# Patient Record
Sex: Female | Born: 1950 | Race: White | Hispanic: No | Marital: Single | Smoking: Current every day smoker
Health system: Southern US, Community
[De-identification: ages and names within clinical notes are randomized; demographics above are authoritative.]

## PROBLEM LIST (undated history)

## (undated) DIAGNOSIS — R0989 Other specified symptoms and signs involving the circulatory and respiratory systems: Secondary | ICD-10-CM

## (undated) DIAGNOSIS — I219 Acute myocardial infarction, unspecified: Secondary | ICD-10-CM

## (undated) DIAGNOSIS — K219 Gastro-esophageal reflux disease without esophagitis: Secondary | ICD-10-CM

## (undated) DIAGNOSIS — F4310 Post-traumatic stress disorder, unspecified: Secondary | ICD-10-CM

## (undated) DIAGNOSIS — C801 Malignant (primary) neoplasm, unspecified: Secondary | ICD-10-CM

## (undated) DIAGNOSIS — R011 Cardiac murmur, unspecified: Secondary | ICD-10-CM

## (undated) DIAGNOSIS — J449 Chronic obstructive pulmonary disease, unspecified: Secondary | ICD-10-CM

## (undated) DIAGNOSIS — F32A Depression, unspecified: Secondary | ICD-10-CM

## (undated) DIAGNOSIS — O039 Complete or unspecified spontaneous abortion without complication: Secondary | ICD-10-CM

## (undated) DIAGNOSIS — J189 Pneumonia, unspecified organism: Secondary | ICD-10-CM

## (undated) DIAGNOSIS — I10 Essential (primary) hypertension: Secondary | ICD-10-CM

## (undated) DIAGNOSIS — I519 Heart disease, unspecified: Secondary | ICD-10-CM

## (undated) DIAGNOSIS — I509 Heart failure, unspecified: Secondary | ICD-10-CM

## (undated) DIAGNOSIS — R0602 Shortness of breath: Secondary | ICD-10-CM

## (undated) DIAGNOSIS — M1990 Unspecified osteoarthritis, unspecified site: Secondary | ICD-10-CM

## (undated) DIAGNOSIS — F429 Obsessive-compulsive disorder, unspecified: Secondary | ICD-10-CM

## (undated) DIAGNOSIS — J40 Bronchitis, not specified as acute or chronic: Secondary | ICD-10-CM

## (undated) DIAGNOSIS — Z95810 Presence of automatic (implantable) cardiac defibrillator: Secondary | ICD-10-CM

## (undated) DIAGNOSIS — E7800 Pure hypercholesterolemia, unspecified: Secondary | ICD-10-CM

## (undated) DIAGNOSIS — I4891 Unspecified atrial fibrillation: Secondary | ICD-10-CM

## (undated) DIAGNOSIS — F419 Anxiety disorder, unspecified: Secondary | ICD-10-CM

## (undated) DIAGNOSIS — F329 Major depressive disorder, single episode, unspecified: Secondary | ICD-10-CM

## (undated) HISTORY — DX: Heart disease, unspecified: I51.9

## (undated) HISTORY — DX: Other specified symptoms and signs involving the circulatory and respiratory systems: R09.89

## (undated) HISTORY — DX: Obsessive-compulsive disorder, unspecified: F42.9

## (undated) HISTORY — DX: Anxiety disorder, unspecified: F41.9

## (undated) HISTORY — DX: Essential (primary) hypertension: I10

## (undated) HISTORY — DX: Malignant (primary) neoplasm, unspecified: C80.1

## (undated) HISTORY — DX: Bronchitis, not specified as acute or chronic: J40

## (undated) HISTORY — DX: Pure hypercholesterolemia, unspecified: E78.00

## (undated) HISTORY — DX: Pneumonia, unspecified organism: J18.9

## (undated) HISTORY — DX: Unspecified osteoarthritis, unspecified site: M19.90

## (undated) HISTORY — DX: Chronic obstructive pulmonary disease, unspecified: J44.9

## (undated) HISTORY — DX: Post-traumatic stress disorder, unspecified: F43.10

## (undated) HISTORY — DX: Acute myocardial infarction, unspecified: I21.9

## (undated) HISTORY — DX: Major depressive disorder, single episode, unspecified: F32.9

## (undated) HISTORY — DX: Unspecified atrial fibrillation: I48.91

## (undated) HISTORY — DX: Complete or unspecified spontaneous abortion without complication: O03.9

## (undated) HISTORY — DX: Depression, unspecified: F32.A

## (undated) HISTORY — PX: INDUCED ABORTION: SHX677

---

## 1983-06-30 DIAGNOSIS — O039 Complete or unspecified spontaneous abortion without complication: Secondary | ICD-10-CM

## 1983-06-30 HISTORY — DX: Complete or unspecified spontaneous abortion without complication: O03.9

## 1985-06-29 HISTORY — PX: FINGER SURGERY: SHX640

## 1999-04-15 ENCOUNTER — Encounter: Payer: Self-pay | Admitting: General Practice

## 1999-04-15 ENCOUNTER — Ambulatory Visit (HOSPITAL_COMMUNITY): Admission: RE | Admit: 1999-04-15 | Discharge: 1999-04-15 | Payer: Self-pay | Admitting: General Practice

## 1999-07-15 ENCOUNTER — Ambulatory Visit (HOSPITAL_COMMUNITY): Admission: RE | Admit: 1999-07-15 | Discharge: 1999-07-15 | Payer: Self-pay | Admitting: General Practice

## 1999-07-15 ENCOUNTER — Encounter: Payer: Self-pay | Admitting: General Practice

## 2001-09-27 ENCOUNTER — Emergency Department (HOSPITAL_COMMUNITY): Admission: EM | Admit: 2001-09-27 | Discharge: 2001-09-28 | Payer: Self-pay | Admitting: Emergency Medicine

## 2004-05-15 ENCOUNTER — Emergency Department (HOSPITAL_COMMUNITY): Admission: EM | Admit: 2004-05-15 | Discharge: 2004-05-15 | Payer: Self-pay

## 2005-09-13 ENCOUNTER — Emergency Department (HOSPITAL_COMMUNITY): Admission: EM | Admit: 2005-09-13 | Discharge: 2005-09-13 | Payer: Self-pay | Admitting: Emergency Medicine

## 2009-02-14 ENCOUNTER — Emergency Department (HOSPITAL_COMMUNITY): Admission: EM | Admit: 2009-02-14 | Discharge: 2009-02-14 | Payer: Self-pay | Admitting: Emergency Medicine

## 2009-03-05 ENCOUNTER — Emergency Department (HOSPITAL_COMMUNITY): Admission: EM | Admit: 2009-03-05 | Discharge: 2009-03-05 | Payer: Self-pay | Admitting: Emergency Medicine

## 2009-04-14 ENCOUNTER — Inpatient Hospital Stay (HOSPITAL_COMMUNITY): Admission: EM | Admit: 2009-04-14 | Discharge: 2009-04-22 | Payer: Self-pay | Admitting: Emergency Medicine

## 2009-04-15 ENCOUNTER — Encounter (INDEPENDENT_AMBULATORY_CARE_PROVIDER_SITE_OTHER): Payer: Self-pay | Admitting: Internal Medicine

## 2009-05-10 ENCOUNTER — Ambulatory Visit: Payer: Self-pay | Admitting: Internal Medicine

## 2009-05-10 DIAGNOSIS — J449 Chronic obstructive pulmonary disease, unspecified: Secondary | ICD-10-CM

## 2009-05-10 DIAGNOSIS — I10 Essential (primary) hypertension: Secondary | ICD-10-CM | POA: Insufficient documentation

## 2009-05-10 DIAGNOSIS — I428 Other cardiomyopathies: Secondary | ICD-10-CM

## 2009-05-10 DIAGNOSIS — E079 Disorder of thyroid, unspecified: Secondary | ICD-10-CM | POA: Insufficient documentation

## 2009-05-10 DIAGNOSIS — J4489 Other specified chronic obstructive pulmonary disease: Secondary | ICD-10-CM | POA: Insufficient documentation

## 2009-05-10 DIAGNOSIS — F429 Obsessive-compulsive disorder, unspecified: Secondary | ICD-10-CM | POA: Insufficient documentation

## 2009-06-10 DIAGNOSIS — R7309 Other abnormal glucose: Secondary | ICD-10-CM

## 2009-06-10 LAB — CONVERTED CEMR LAB
ALT: 15 units/L (ref 0–35)
Alkaline Phosphatase: 97 units/L (ref 39–117)
CO2: 23 meq/L (ref 19–32)
Calcium: 9.3 mg/dL (ref 8.4–10.5)
Creatinine, Ser: 0.72 mg/dL (ref 0.40–1.20)
Free T4: 1.37 ng/dL (ref 0.80–1.80)
HCT: 42.5 % (ref 36.0–46.0)
Lymphocytes Relative: 29 % (ref 12–46)
Lymphs Abs: 2.4 10*3/uL (ref 0.7–4.0)
MCHC: 30.6 g/dL (ref 30.0–36.0)
MCV: 87.3 fL (ref 78.0–100.0)
Monocytes Absolute: 0.6 10*3/uL (ref 0.1–1.0)
Monocytes Relative: 7 % (ref 3–12)
Platelets: 461 10*3/uL — ABNORMAL HIGH (ref 150–400)
Potassium: 4.2 meq/L (ref 3.5–5.3)
RDW: 15.8 % — ABNORMAL HIGH (ref 11.5–15.5)
Sodium: 139 meq/L (ref 135–145)
T3, Free: 2.7 pg/mL (ref 2.3–4.2)
Total Bilirubin: 0.4 mg/dL (ref 0.3–1.2)
WBC: 8.5 10*3/uL (ref 4.0–10.5)

## 2009-06-17 ENCOUNTER — Encounter (INDEPENDENT_AMBULATORY_CARE_PROVIDER_SITE_OTHER): Payer: Self-pay | Admitting: Internal Medicine

## 2009-06-18 ENCOUNTER — Ambulatory Visit: Payer: Self-pay | Admitting: Internal Medicine

## 2009-06-18 DIAGNOSIS — F341 Dysthymic disorder: Secondary | ICD-10-CM

## 2009-06-18 DIAGNOSIS — G4700 Insomnia, unspecified: Secondary | ICD-10-CM | POA: Insufficient documentation

## 2009-06-19 ENCOUNTER — Telehealth (INDEPENDENT_AMBULATORY_CARE_PROVIDER_SITE_OTHER): Payer: Self-pay | Admitting: Internal Medicine

## 2009-06-29 HISTORY — PX: COLON SURGERY: SHX602

## 2009-07-01 ENCOUNTER — Telehealth (INDEPENDENT_AMBULATORY_CARE_PROVIDER_SITE_OTHER): Payer: Self-pay | Admitting: Internal Medicine

## 2009-07-03 ENCOUNTER — Ambulatory Visit: Payer: Self-pay | Admitting: Internal Medicine

## 2009-07-03 DIAGNOSIS — B9789 Other viral agents as the cause of diseases classified elsewhere: Secondary | ICD-10-CM

## 2009-07-03 DIAGNOSIS — J441 Chronic obstructive pulmonary disease with (acute) exacerbation: Secondary | ICD-10-CM | POA: Insufficient documentation

## 2009-07-11 ENCOUNTER — Ambulatory Visit: Payer: Self-pay | Admitting: Internal Medicine

## 2009-07-11 DIAGNOSIS — M2550 Pain in unspecified joint: Secondary | ICD-10-CM

## 2009-07-17 ENCOUNTER — Telehealth (INDEPENDENT_AMBULATORY_CARE_PROVIDER_SITE_OTHER): Payer: Self-pay | Admitting: Internal Medicine

## 2009-07-19 ENCOUNTER — Encounter (INDEPENDENT_AMBULATORY_CARE_PROVIDER_SITE_OTHER): Payer: Self-pay | Admitting: Internal Medicine

## 2009-07-30 ENCOUNTER — Emergency Department (HOSPITAL_COMMUNITY): Admission: EM | Admit: 2009-07-30 | Discharge: 2009-07-30 | Payer: Self-pay | Admitting: Emergency Medicine

## 2009-07-31 ENCOUNTER — Encounter (INDEPENDENT_AMBULATORY_CARE_PROVIDER_SITE_OTHER): Payer: Self-pay | Admitting: Internal Medicine

## 2009-08-13 ENCOUNTER — Ambulatory Visit: Payer: Self-pay | Admitting: Internal Medicine

## 2009-08-19 ENCOUNTER — Telehealth (INDEPENDENT_AMBULATORY_CARE_PROVIDER_SITE_OTHER): Payer: Self-pay | Admitting: Internal Medicine

## 2009-08-21 ENCOUNTER — Encounter (INDEPENDENT_AMBULATORY_CARE_PROVIDER_SITE_OTHER): Payer: Self-pay | Admitting: Internal Medicine

## 2009-08-29 ENCOUNTER — Ambulatory Visit: Payer: Self-pay | Admitting: Internal Medicine

## 2009-09-17 ENCOUNTER — Ambulatory Visit: Payer: Self-pay | Admitting: Internal Medicine

## 2009-09-29 ENCOUNTER — Inpatient Hospital Stay (HOSPITAL_COMMUNITY): Admission: EM | Admit: 2009-09-29 | Discharge: 2009-10-03 | Payer: Self-pay | Admitting: Emergency Medicine

## 2009-09-30 ENCOUNTER — Encounter (INDEPENDENT_AMBULATORY_CARE_PROVIDER_SITE_OTHER): Payer: Self-pay | Admitting: Cardiology

## 2009-10-11 ENCOUNTER — Ambulatory Visit: Payer: Self-pay | Admitting: Internal Medicine

## 2009-10-11 DIAGNOSIS — J309 Allergic rhinitis, unspecified: Secondary | ICD-10-CM | POA: Insufficient documentation

## 2009-10-12 ENCOUNTER — Encounter (INDEPENDENT_AMBULATORY_CARE_PROVIDER_SITE_OTHER): Payer: Self-pay | Admitting: Internal Medicine

## 2009-10-15 ENCOUNTER — Ambulatory Visit: Payer: Self-pay | Admitting: Internal Medicine

## 2009-10-21 ENCOUNTER — Telehealth (INDEPENDENT_AMBULATORY_CARE_PROVIDER_SITE_OTHER): Payer: Self-pay | Admitting: *Deleted

## 2009-10-21 ENCOUNTER — Ambulatory Visit: Payer: Self-pay | Admitting: Internal Medicine

## 2009-10-27 HISTORY — PX: CARDIAC DEFIBRILLATOR PLACEMENT: SHX171

## 2009-10-30 ENCOUNTER — Encounter (INDEPENDENT_AMBULATORY_CARE_PROVIDER_SITE_OTHER): Payer: Self-pay | Admitting: *Deleted

## 2009-10-30 ENCOUNTER — Ambulatory Visit: Payer: Self-pay | Admitting: Internal Medicine

## 2009-10-30 DIAGNOSIS — I480 Paroxysmal atrial fibrillation: Secondary | ICD-10-CM

## 2009-11-12 ENCOUNTER — Ambulatory Visit: Payer: Self-pay | Admitting: Internal Medicine

## 2009-11-13 ENCOUNTER — Encounter: Payer: Self-pay | Admitting: Internal Medicine

## 2009-11-14 ENCOUNTER — Ambulatory Visit: Payer: Self-pay | Admitting: Internal Medicine

## 2009-11-15 LAB — CONVERTED CEMR LAB
Basophils Absolute: 0.1 10*3/uL (ref 0.0–0.1)
Basophils Relative: 0.7 % (ref 0.0–3.0)
Calcium: 9.5 mg/dL (ref 8.4–10.5)
Chloride: 104 meq/L (ref 96–112)
Creatinine, Ser: 0.8 mg/dL (ref 0.4–1.2)
Eosinophils Relative: 1.4 % (ref 0.0–5.0)
GFR calc non Af Amer: 82.19 mL/min (ref >60)
HCT: 38.9 % (ref 36.0–46.0)
Lymphocytes Relative: 32.2 % (ref 12.0–46.0)
Lymphs Abs: 2.5 10*3/uL (ref 0.7–4.0)
Monocytes Absolute: 0.3 10*3/uL (ref 0.1–1.0)
Neutrophils Relative %: 61.4 % (ref 43.0–77.0)
Potassium: 4.2 meq/L (ref 3.5–5.1)
Prothrombin Time: 17.2 s — ABNORMAL HIGH (ref 9.1–11.7)
RDW: 17 % — ABNORMAL HIGH (ref 11.5–14.6)
aPTT: 28.8 s (ref 21.7–28.8)

## 2009-11-18 ENCOUNTER — Observation Stay (HOSPITAL_COMMUNITY): Admission: RE | Admit: 2009-11-18 | Discharge: 2009-11-19 | Payer: Self-pay | Admitting: Internal Medicine

## 2009-11-18 ENCOUNTER — Ambulatory Visit: Payer: Self-pay | Admitting: Internal Medicine

## 2009-11-19 ENCOUNTER — Encounter: Payer: Self-pay | Admitting: Internal Medicine

## 2009-12-02 ENCOUNTER — Encounter: Payer: Self-pay | Admitting: Internal Medicine

## 2009-12-12 ENCOUNTER — Ambulatory Visit: Payer: Self-pay

## 2009-12-12 ENCOUNTER — Ambulatory Visit: Payer: Self-pay | Admitting: Internal Medicine

## 2009-12-31 ENCOUNTER — Encounter: Admission: RE | Admit: 2009-12-31 | Discharge: 2010-02-27 | Payer: Self-pay | Admitting: Internal Medicine

## 2010-01-02 ENCOUNTER — Encounter (INDEPENDENT_AMBULATORY_CARE_PROVIDER_SITE_OTHER): Payer: Self-pay | Admitting: Internal Medicine

## 2010-01-10 ENCOUNTER — Encounter (INDEPENDENT_AMBULATORY_CARE_PROVIDER_SITE_OTHER): Payer: Self-pay | Admitting: *Deleted

## 2010-01-14 ENCOUNTER — Ambulatory Visit: Payer: Self-pay | Admitting: Internal Medicine

## 2010-01-14 DIAGNOSIS — R259 Unspecified abnormal involuntary movements: Secondary | ICD-10-CM | POA: Insufficient documentation

## 2010-01-14 LAB — CONVERTED CEMR LAB: Hgb A1c MFr Bld: 5.9 % — ABNORMAL HIGH (ref <5.7)

## 2010-01-25 ENCOUNTER — Encounter (INDEPENDENT_AMBULATORY_CARE_PROVIDER_SITE_OTHER): Payer: Self-pay | Admitting: Internal Medicine

## 2010-02-21 ENCOUNTER — Ambulatory Visit: Payer: Self-pay | Admitting: Internal Medicine

## 2010-02-27 ENCOUNTER — Encounter (INDEPENDENT_AMBULATORY_CARE_PROVIDER_SITE_OTHER): Payer: Self-pay | Admitting: Internal Medicine

## 2010-03-10 ENCOUNTER — Encounter (INDEPENDENT_AMBULATORY_CARE_PROVIDER_SITE_OTHER): Payer: Self-pay | Admitting: Nurse Practitioner

## 2010-03-10 ENCOUNTER — Ambulatory Visit: Payer: Self-pay | Admitting: Internal Medicine

## 2010-03-10 DIAGNOSIS — S99919A Unspecified injury of unspecified ankle, initial encounter: Secondary | ICD-10-CM

## 2010-03-10 DIAGNOSIS — S99929A Unspecified injury of unspecified foot, initial encounter: Secondary | ICD-10-CM

## 2010-03-10 DIAGNOSIS — S8990XA Unspecified injury of unspecified lower leg, initial encounter: Secondary | ICD-10-CM | POA: Insufficient documentation

## 2010-03-13 ENCOUNTER — Ambulatory Visit: Payer: Self-pay | Admitting: Internal Medicine

## 2010-03-13 DIAGNOSIS — R609 Edema, unspecified: Secondary | ICD-10-CM | POA: Insufficient documentation

## 2010-03-13 DIAGNOSIS — M545 Low back pain: Secondary | ICD-10-CM | POA: Insufficient documentation

## 2010-03-17 ENCOUNTER — Inpatient Hospital Stay (HOSPITAL_COMMUNITY): Admission: EM | Admit: 2010-03-17 | Discharge: 2010-03-18 | Payer: Self-pay | Admitting: Emergency Medicine

## 2010-03-20 ENCOUNTER — Telehealth (INDEPENDENT_AMBULATORY_CARE_PROVIDER_SITE_OTHER): Payer: Self-pay | Admitting: Internal Medicine

## 2010-03-25 ENCOUNTER — Ambulatory Visit: Payer: Self-pay | Admitting: Internal Medicine

## 2010-03-28 ENCOUNTER — Ambulatory Visit: Payer: Self-pay | Admitting: Internal Medicine

## 2010-03-28 LAB — CONVERTED CEMR LAB
BUN: 22 mg/dL (ref 6–23)
CO2: 23 meq/L (ref 19–32)
Calcium: 9.5 mg/dL (ref 8.4–10.5)
Creatinine, Ser: 0.93 mg/dL (ref 0.40–1.20)
Glucose, Bld: 101 mg/dL — ABNORMAL HIGH (ref 70–99)
Potassium: 5 meq/L (ref 3.5–5.3)
Sodium: 140 meq/L (ref 135–145)

## 2010-04-01 ENCOUNTER — Ambulatory Visit: Payer: Self-pay | Admitting: Internal Medicine

## 2010-04-01 DIAGNOSIS — Z95810 Presence of automatic (implantable) cardiac defibrillator: Secondary | ICD-10-CM | POA: Insufficient documentation

## 2010-04-03 ENCOUNTER — Encounter (INDEPENDENT_AMBULATORY_CARE_PROVIDER_SITE_OTHER): Payer: Self-pay | Admitting: Internal Medicine

## 2010-04-25 ENCOUNTER — Encounter (INDEPENDENT_AMBULATORY_CARE_PROVIDER_SITE_OTHER): Payer: Self-pay | Admitting: Internal Medicine

## 2010-04-29 ENCOUNTER — Encounter (INDEPENDENT_AMBULATORY_CARE_PROVIDER_SITE_OTHER): Payer: Self-pay | Admitting: Internal Medicine

## 2010-04-29 ENCOUNTER — Encounter
Admission: RE | Admit: 2010-04-29 | Discharge: 2010-04-29 | Payer: Self-pay | Source: Home / Self Care | Attending: Internal Medicine | Admitting: Internal Medicine

## 2010-05-26 ENCOUNTER — Emergency Department (HOSPITAL_COMMUNITY): Admission: EM | Admit: 2010-05-26 | Discharge: 2010-05-26 | Payer: Self-pay | Admitting: Family Medicine

## 2010-05-26 DIAGNOSIS — S92309A Fracture of unspecified metatarsal bone(s), unspecified foot, initial encounter for closed fracture: Secondary | ICD-10-CM | POA: Insufficient documentation

## 2010-05-27 ENCOUNTER — Telehealth (INDEPENDENT_AMBULATORY_CARE_PROVIDER_SITE_OTHER): Payer: Self-pay | Admitting: Internal Medicine

## 2010-06-05 ENCOUNTER — Ambulatory Visit: Payer: Self-pay | Admitting: Internal Medicine

## 2010-06-05 ENCOUNTER — Telehealth (INDEPENDENT_AMBULATORY_CARE_PROVIDER_SITE_OTHER): Payer: Self-pay | Admitting: Internal Medicine

## 2010-06-18 ENCOUNTER — Encounter (INDEPENDENT_AMBULATORY_CARE_PROVIDER_SITE_OTHER): Payer: Self-pay | Admitting: Internal Medicine

## 2010-06-24 ENCOUNTER — Ambulatory Visit: Payer: Self-pay | Admitting: Internal Medicine

## 2010-06-26 ENCOUNTER — Ambulatory Visit: Payer: Self-pay

## 2010-06-26 ENCOUNTER — Ambulatory Visit: Payer: Self-pay | Admitting: Internal Medicine

## 2010-07-02 ENCOUNTER — Encounter: Payer: Self-pay | Admitting: Internal Medicine

## 2010-07-02 ENCOUNTER — Telehealth (INDEPENDENT_AMBULATORY_CARE_PROVIDER_SITE_OTHER): Payer: Self-pay | Admitting: Internal Medicine

## 2010-07-24 ENCOUNTER — Encounter: Payer: Self-pay | Admitting: Internal Medicine

## 2010-07-31 NOTE — Assessment & Plan Note (Signed)
Summary: 1 mo f/u anxiety & depression///rjp   Vital Signs:  Patient profile:   60 year old female Weight:      184 pounds Temp:     98.3 degrees F Pulse rate:   62 / minute Pulse rhythm:   regular Resp:     18 per minute BP sitting:   110 / 73  (left arm) Cuff size:   regular  Vitals Entered By: Gaylyn Cheers RN (Nov 12, 2009 2:11 PM) CC: pt's states she is not sure why she is here.  Per CC one month f/u ofr anxiety/depression.  She says she is having a defibulater put in on the 23rd. Is Patient Diabetic? No  Does patient need assistance? Ambulation Normal   CC:  pt's states she is not sure why she is here.  Per CC one month f/u ofr anxiety/depression.  She says she is having a defibulater put in on the 23rd.Marland Kitchen  History of Present Illness: 1.  DM:  Pt. has been working on diet.  Did not get a call from Nutrition, but did not realize she was to notify  us if that was the case.  Did get the Retasure done earlier this month.  2.  Depression/anxiety:  Feels she is coming to terms with her health limitations.  Still not sleeping.  Did not come in to see Aquilla Solian as she did not get good sleep last week and was too sleepy to drive.  Trazadone initially helped, but no longer at 50 mg.  Has not tried taking 100 mg at one time--3-4 hours apart between tabs and did not note improvement. Problems only with initiating sleep.  Uses TV to fall asleep.  3.  Allergies:  Daughter's cats locked out of room--allergies better controlled.  Dryness she was having better after stopping Afrin.  Allergies (verified): 1)  ! * Atrovent 2)  ! * Ambien 3)  ! * Flovent  Physical Exam  General:  Very anxious--jumps from one topic to another. Nose:  Mild mucosal congestion, clear discharge Lungs:  Normal respiratory effort, chest expands symmetrically. Lungs are clear to auscultation, no crackles or wheezes. Heart:  Normal rate and regular rhythm. S1 and S2 normal without gallop, murmur, click, rub or  other extra sounds.  Radial pulses normal and equal Extremities:  No edema   Impression & Recommendations:  Problem # 1:  ALLERGIC RHINITIS WITH CONJUNCTIVITIS (ICD-477.9) Improved with meds Her updated medication list for this problem includes:    Flonase 50 Mcg/act Susp (Fluticasone propionate) .Marland Kitchen... 2 sprays each nostril once daily  Problem # 2:  DIABETES MELLITUS, TYPE II, CONTROLLED (ICD-250.00) Rerefer to Nutrition Her updated medication list for this problem includes:    Lisinopril 20 Mg Tabs (Lisinopril) .Marland Kitchen... 1/2 tab by mouth two times a day    Aspirin 325 Mg Tabs (Aspirin) .Marland Kitchen... 1/2 tab by mouth daily  Problem # 3:  INSOMNIA (ICD-780.52) INcrease Trazodone to 2 tabs at bedtime  Problem # 4:  DEPRESSION/ANXIETY (ICD-300.4) Send back to [facility]. 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 1/2 tab by mouth two times a day 2)  Pacerone 200 Mg Tabs (Amiodarone hcl) .... Take one tablet by once daily 3)  Klor-con M20 20 Meq Cr-tabs (Potassium chloride crys cr) .... Take 2 tablets by mouth everyday 4)  Simvastatin 20 Mg Tabs (  Simvastatin) .... 1/2  tablet by mouth everyday 5)  Warfarin Sodium 5 Mg Tabs (Warfarin sodium) .Marland Kitchen.. 1 tab by mouth daily except thursday, take 1 1/2 tabs 6)  Metoprolol Tartrate 25 Mg Tabs (Metoprolol tartrate) .Marland Kitchen.. 1 tab by mouth two times a day 7)  Aspirin 325 Mg Tabs (Aspirin) .... 1/2 tab by mouth daily 8)  Furosemide 40 Mg Tabs (Furosemide) .... Take one tablet by mouth once daily 9)  Vitamin B-12 500 Mcg Tabs (Cyanocobalamin) 10)  Vitamin E 400 Unit Caps (Vitamin e) 11)  Zoloft 100 Mg Tabs (Sertraline hcl) .Marland Kitchen.. 1 tab by mouth daily 12)  Trazodone Hcl 50 Mg Tabs (Trazodone hcl) .... 2 tabs by mouth at bedtime 13)  Proventil Hfa 108 (90 Base) Mcg/act Aers  (Albuterol sulfate) .... 2 puffs every 4 hours as needed 14)  Vitamin D 1000 Iu  .Marland Kitchen.. 1 tab by mouth daily 15)  Flonase 50 Mcg/act Susp (Fluticasone propionate) .... 2 sprays each nostril once daily  Patient Instructions: 1)  Call if you do not hear from Nutrition in 2 weeks. 2)  Follow up with Dr. Delrae Alfred in 2 months  Prescriptions: FLONASE 50 MCG/ACT SUSP (FLUTICASONE PROPIONATE) 2 sprays each nostril once daily  #1 x 11   Entered and Authorized by:   Julieanne Manson MD   Signed by:   Julieanne Manson MD on 11/13/2009   Method used:   Faxed to ...       [facility] (retail)       [address]       [place], [place]  [id number]       Ph: [phone]       Fax: [phone]   RxID:   [id number] PROVENTIL HFA 108 (90 BASE) MCG/ACT AERS (ALBUTEROL SULFATE) 2 puffs every 4 hours as needed  #1 x 6   Entered and Authorized by:   Julieanne Manson MD   Signed by:   Julieanne Manson MD on 11/13/2009   Method used:   Faxed to ...       [facility] (retail)       [address]       [place], [place]  [id number]       Ph: [phone]       Fax: [phone]   RxID:   [id number] TRAZODONE HCL 50 MG TABS (TRAZODONE HCL) 2 tabs by mouth at bedtime  #60 x 11   Entered and Authorized by:   Julieanne Manson MD   Signed by:   Julieanne Manson MD on 11/13/2009   Method used:   Faxed to ...       [facility] (retail)       [address]       [place], [place]  [address]       Ph: [phone]       Fax: [phone]   RxID:   [id number] ZOLOFT 100 MG TABS (SERTRALINE HCL) 1 tab by mouth daily  #30 x 11   Entered and Authorized by:   Julieanne Manson MD   Signed by:   Julieanne Manson MD on 11/13/2009   Method used:   Faxed to ...       [facility] (retail)       [address]       [place], [place]  [address]       Ph: [phone]       Fax: [phone]  RxID:   [id number] FUROSEMIDE 40 MG TABS (FUROSEMIDE) take one tablet by mouth once daily  #30 x 11   Entered and Authorized by:   Julieanne Manson MD   Signed by:   Julieanne Manson MD on 11/13/2009   Method used:   Faxed to ...       [facility] (retail)       [address]       [place], [place]  [address]       Ph: [phone]       Fax: [phone]   RxID:   [id number] METOPROLOL TARTRATE 25 MG TABS (METOPROLOL TARTRATE) 1 tab by mouth two times a day  #60 x 11   Entered and Authorized by:   Julieanne Manson MD   Signed by:   Julieanne Manson MD on 11/13/2009   Method used:   Faxed to ...       [facility] (retail)       [address]       [place], [place]  [address]       Ph: [phone]       Fax: [phone]   RxID:   [id number] WARFARIN SODIUM 5 MG TABS (WARFARIN SODIUM) 1 tab by mouth daily except Thursday, take 1 1/2 tabs  #45 x 6   Entered and Authorized by:   Julieanne Manson MD   Signed by:   Julieanne Manson MD on 11/13/2009   Method used:   Faxed to ...       [facility] (retail)       [address]       [place], [place]  [address]       Ph: [phone]       Fax: [phone]   RxID:   [id number] SIMVASTATIN 20 MG TABS (SIMVASTATIN) 1/2  tablet by mouth everyday  #15 x 11   Entered and Authorized by:   Julieanne Manson MD   Signed by:   Julieanne Manson MD on 11/13/2009   Method used:   Faxed to ...       [facility] (retail)       [address]       [place], [place]  [address]       Ph: [phone]       Fax: [phone]   RxID:   [id number] KLOR-CON M20 20 MEQ CR-TABS (POTASSIUM CHLORIDE CRYS CR) take 2 tablets by mouth everyday  #60 x 11   Entered and Authorized by:   Julieanne Manson MD   Signed by:   Julieanne Manson MD on 11/13/2009   Method used:   Faxed to ...       [facility] (retail)       [address]       [place], [place]  [address]       Ph: [phone]       Fax: [phone]   RxID:   [id number] PACERONE 200 MG TABS (AMIODARONE HCL) take one tablet by once daily  #30 x 11   Entered and Authorized by:   Julieanne Manson MD   Signed by:   Julieanne Manson MD on 11/13/2009   Method used:   Faxed to ...       [facility] (retail)       [address]       [place], [place]  [address]  Ph: [phone]       Fax: [phone]   RxID:   [id number] LISINOPRIL 20 MG TABS (LISINOPRIL) 1/2 tab by mouth two times a day  #30 x 11   Entered and Authorized by:   Julieanne Manson MD   Signed by:   Julieanne Manson MD on 11/13/2009   Method used:   Faxed to ...       [facility] (retail)       [address]       [place], [place]  [address]       Ph: [phone]       Fax: [phone]   RxID:   [id number] FLONASE 50 MCG/ACT SUSP (FLUTICASONE PROPIONATE) 2 sprays each nostril once daily  #1 x 0   Entered and Authorized by:   Julieanne Manson MD   Signed by:   Julieanne Manson MD on 11/13/2009   Method used:   Faxed to ...       [facility] (retail)       [address] [place], [place]  [address]       Ph: [phone]       Fax: [phone]   RxID:   [id number] PROVENTIL HFA 108 (90 BASE) MCG/ACT AERS (ALBUTEROL SULFATE) 2 puffs every 4 hours as needed  #1 x 0   Entered and Authorized by:   Julieanne Manson MD   Signed by:   Julieanne Manson MD on 11/13/2009   Method used:   Faxed to ...       [facility] (retail)       [address] [place], [place]  [address]       Ph: [phone]       Fax: [phone]   RxID:   [id number] TRAZODONE HCL 50 MG TABS (TRAZODONE HCL) 2 tabs by mouth at bedtime  #60 x 0   Entered and Authorized by:   Julieanne Manson  MD   Signed by:   Julieanne Manson MD on 11/13/2009   Method used:   Faxed to ...       [facility] (retail)       [address] [place], [place]  [address]       Ph: [phone]       Fax: [phone]   RxID:   [id number] ZOLOFT 100 MG TABS (SERTRALINE HCL) 1 tab by mouth daily  #30 x 0   Entered and Authorized by:   Julieanne Manson MD   Signed by:   Julieanne Manson MD on 11/13/2009   Method used:   Faxed to ...       [facility] (retail)       [address] [place], [place]  [address]       Ph: [phone]       Fax: [phone]   RxID:   [id number] FUROSEMIDE 40 MG TABS (FUROSEMIDE) take one tablet by mouth once daily  #30 x 0   Entered and Authorized by:   Julieanne Manson MD   Signed by:   Julieanne Manson MD on 11/13/2009   Method used:   Faxed to ...       [facility] (retail)       [address]  [name] [place]  [address]       Ph: [phone]       Fax: [phone]   RxID:   [id number] METOPROLOL TARTRATE 25 MG TABS (METOPROLOL TARTRATE) 1 tab by mouth two times a day  #60 x 0   Entered and Authorized by:   Julieanne Manson MD   Signed by:   Julieanne Manson MD on 11/13/2009   Method used:   Faxed to ...       [facility] (retail)       [address] [place], [place]  [address]       Ph: [phone]       Fax: [phone]   RxID:   [id number] WARFARIN SODIUM 5 MG TABS (WARFARIN SODIUM) 1 tab by mouth daily except Thursday, take 1 1/2 tabs  #45 x 0   Entered and Authorized by:   Julieanne Manson MD   Signed by:   Julieanne Manson MD on 11/13/2009   Method used:   Faxed to ...       [facility] - [facility] (retail)       [address] [place], [place]  [address]       Ph: [phone]       Fax:  [phone]   RxID:   [id number] SIMVASTATIN 20 MG TABS (SIMVASTATIN) 1/2  tablet by mouth everyday  #15 x 0   Entered and Authorized by:   Julieanne Manson MD   Signed by:   Julieanne Manson MD on 11/13/2009   Method used:   Faxed to ...       [facility] - [facility] (retail)       [address] [place], [place]  [address]       Ph: [phone]       Fax: [phone]   RxID:   [id number] KLOR-CON M20 20 MEQ CR-TABS (POTASSIUM CHLORIDE CRYS CR) take 2 tablets by mouth everyday  #60 x 0   Entered and Authorized by:   Julieanne Manson MD   Signed by:   Julieanne Manson MD on 11/13/2009   Method used:   Faxed to ...       [facility] - [facility] (retail)       [address] [place], [place]  [address]       Ph: [phone]       Fax: [phone]   RxID:   [id number] PACERONE 200 MG TABS (AMIODARONE HCL) take one tablet by once daily  #30 x 0   Entered and Authorized by:   Julieanne Manson MD   Signed by:   Julieanne Manson MD on 11/13/2009   Method used:   Faxed to ...       [facility] - [facility] (retail)       [address] [place], [place]  [address]       Ph: [phone]       Fax: [phone]   RxID:   [id number] LISINOPRIL 20 MG TABS (LISINOPRIL) 1/2 tab by mouth two times a day  #30 x 0   Entered and Authorized by:   Julieanne Manson MD   Signed by:   Julieanne Manson MD on 11/13/2009   Method used:  Faxed to ...       [facility] - [facility] (retail)       [address] [place], [place]  [address]       Ph: [phone]       Fax: [phone]   RxID:   [id number]  One month Rxs sent to [facility] until Medicaid coverage starts with next month's fill at [facility].

## 2010-07-31 NOTE — Letter (Signed)
Summary: *HSN Results Follow up  [facility]  [address] [place], [place] [address]   Phone: [phone]  Fax: [phone]      04/25/2010   VARSHA KNOCK [address] [place], [place]  [address]   Dear  Ms. Jacaria Frost,                            ____S.Drinkard,FNP   ____D. Gore,FNP       ____B. McPherson,MD   ____V. Rankins,MD    _X___E. Samanvitha Germany,MD    ____N. Daphine Deutscher, FNP  ____D. Reche Dixon, MD    ____K. Philipp Deputy, MD    ____Other     This letter is to inform you that your recent test(s):  _______Pap Smear    ____X___Lab Test     _______X-ray    ___X____ is within acceptable limits  _______ requires a medication change  _______ requires a follow-up lab visit  _______ requires a follow-up visit with your provider   Comments:  Labs are fine on increased Lasix.       _________________________________________________________ If you have any questions, please contact our office                     Sincerely,  Julieanne Manson MD [facility]
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
## 2010-07-31 NOTE — Letter (Signed)
Summary: NUTRITION & DIABETES PROGRESS NOTE  NUTRITION & DIABETES PROGRESS NOTE   Imported ByArta Bruce 05/05/2010 16:56:19  _____________________________________________________________________  External Attachment:    Type:   Image     Comment:   External Document

## 2010-07-31 NOTE — Assessment & Plan Note (Signed)
Summary: 2 WEEK FU ON RIGHT TOE///KT   Vital Signs:  Patient profile:   60 year old female Menstrual status:  postmenopausal Weight:      186.4 pounds BP sitting:   106 / 72  (right arm) Cuff size:   large  Vitals Entered By: Michelle Nasuti (March 25, 2010 2:46 PM) CC: follow-up visit Pain Assessment Patient in pain? yes       Does patient need assistance? Ambulation Normal   CC:  follow-up visit.  History of Present Illness: 1.  Leg edema:  has lost 12 lbs.  Did not realize she had so much extra fluid.  Toward end of visit, pt. informed me she ultimately went to ED the following Monday after being seen in our office.  Was in acute on chronic heart failure--diuresed overnight and sent out on two times a day Furosemide rather than and increased morning dose.  Plans to watch her sodium.  2.  Right great toe injury:  Healing much better.   Allergies: 1)  ! * Atrovent 2)  ! * Ambien 3)  ! * Flovent  Physical Exam  Lungs:  Normal respiratory effort, chest expands symmetrically. Lungs are clear to auscultation, no crackles or wheezes. Heart:  RRR.  Radial pulses normal and equal. Extremities:  No edema Right toe with good coloring and buddy taped to next toe.  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  Orders: T-Basic Metabolic Panel [phone])  Problem # 2:  Preventive Health Care (ICD-V70.0) still smoking 1/2 ppd--does not seem motivated to quit. Tdap today Up to date on both Pneumovax and received flu vaccine in hospital   Problem # 3:  FOOT INJURY, RIGHT (ICD-959.7) Healing.  Complete Medication List: 1)  Lisinopril 20 Mg Tabs (Lisinopril) .... 1/2 tab by mouth two times a day 2)  Pacerone 200 Mg Tabs (Amiodarone hcl) .... Take one tablet by once daily 3)  Klor-con M20 20 Meq Cr-tabs (Potassium chloride crys cr) .... Take 2 tablets by  mouth everyday.  take 1 extra tablet daily if take and extra furosemide that day 4)  Simvastatin 20 Mg Tabs (Simvastatin) .... 1/2  tablet by mouth everyday 5)  Warfarin Sodium 5 Mg Tabs (Warfarin sodium) .Marland Kitchen.. 1 tab by mouth daily except thursday, take 1 1/2 tabs 6)  Metoprolol Tartrate 25 Mg Tabs (Metoprolol tartrate) .Marland Kitchen.. 1 tab by mouth two times a day 7)  Aspirin 325 Mg Tabs (Aspirin) .... 1/2 tab by mouth daily 8)  Furosemide 40 Mg Tabs (Furosemide) .... Take one tablet by mouth once daily.  may take another tab daily if increased edema as needed 9)  Vitamin B-12 500 Mcg Tabs (Cyanocobalamin) 10)  Vitamin E 400 Unit Caps (Vitamin e) 11)  Zoloft 100 Mg Tabs (Sertraline hcl) .Marland Kitchen.. 1 tab by mouth daily 12)  Trazodone Hcl 50 Mg Tabs (Trazodone hcl) .... 2 tabs by mouth at bedtime 13)  Ventolin Hfa 108 (90 Base) Mcg/act Aers (Albuterol sulfate) .... 2 puffs every 4 hours as needed for dyspnea 14)  Vitamin D 1000 Iu  .Marland Kitchen.. 1 tab by mouth daily 15)  Flonase 50 Mcg/act Susp (Fluticasone propionate) .... 2 sprays each nostril once daily 16)  Imdur 30 Mg Xr24h-tab (Isosorbide mononitrate) .Marland Kitchen.. 1 tab by mouth daily--03/16/10 17)  Omeprazole 40 Mg Cpdr (Omeprazole) .Marland Kitchen.. 1 cap by mouth daily on empty stomach--1/2 hour before breakfast. 18)  Aspirin  325 Mg Tabs (Aspirin) .... 1/2 tab by mouth daily  Other Orders: Tdap => 79yrs IM ([address]) Admin 1st Vaccine ([address]) Admin 1st Vaccine [facility]) [phone])  Patient Instructions: 1)  Follow up with Dr. Delrae Alfred in 3 months --heart failure follow up Prescriptions: KLOR-CON M20 20 MEQ CR-TABS (POTASSIUM CHLORIDE CRYS CR) take 2 tablets by mouth everyday.  Take 1 extra tablet daily if take and extra Furosemide that day  #45 x 11   Entered and Authorized by:   Julieanne Manson MD   Signed by:   Julieanne Manson MD on 03/28/2010   Method used:   Electronically to        [facility] (retail)       [address]       [place], [place]   [address]       Ph: [phone]       Fax: [phone]   RxID:   [id number] OMEPRAZOLE 40 MG CPDR (OMEPRAZOLE) 1 cap by mouth daily on empty stomach--1/2 hour before breakfast.  #30 x 1   Entered and Authorized by:   Julieanne Manson MD   Signed by:   Julieanne Manson MD on 03/28/2010   Method used:   Electronically to        [facility] (retail)       [address]       [place], [place]  [address]       Ph: [phone]       Fax: [phone]   RxID:   [id number]    Influenza Immunization History:    Influenza # 1:  Historical (03/18/2010)  Tetanus/Td Vaccine    Vaccine Type: Tdap    Site: left deltoid    Mfr: Sanofi Pasteur    Dose: 0.5 ml    Route: IM    Given by: Michelle Nasuti    Exp. Date: 07/11/2012    Lot #: [id number]    VIS given: 05/16/08 version given March 25, 2010.

## 2010-07-31 NOTE — Miscellaneous (Signed)
  Clinical Lists Changes  Problems: Removed problem of DIABETES MELLITUS, TYPE II, CONTROLLED (ICD-250.00)

## 2010-07-31 NOTE — Miscellaneous (Signed)
    Research Study Name: MADIT-RIT

## 2010-07-31 NOTE — Letter (Signed)
Summary: Handout Printed  Printed Handout:  - Toe Fracture

## 2010-07-31 NOTE — Progress Notes (Signed)
Summary: Query:  Refill omeprazole per protocol?  Phone Note Outgoing Call   Summary of Call: Do you want omeprazole refilled per protocol?   Initial call taken by: Dutch Quint RN,  July 02, 2010 5:25 PM  Follow-up for Phone Call        refill for a year Follow-up by: Julieanne Manson MD,  July 03, 2010 8:15 AM  Additional Follow-up for Phone Call Additional follow up Details #1::        Noted.  Refill completed.Dutch Quint RN  July 03, 2010 10:21 AM

## 2010-07-31 NOTE — Cardiovascular Report (Signed)
Summary: Office Visit   Office Visit   Imported By: Roderic Ovens 07/08/2010 16:18:34  _____________________________________________________________________  External Attachment:    Type:   Image     Comment:   External Document

## 2010-07-31 NOTE — Procedures (Signed)
Summary: WOUND CHECK   Allergies: 1)  ! * Atrovent 2)  ! * Ambien 3)  ! * Flovent   ICD Specifications Following MD:  Sherryl Manges, MD     Referring MD:  Thurmon Fair, MD ICD Vendor:  [facility] Scientific     ICD Model Number:  [phone]     ICD Serial Number:  [id number] ICD DOI:  11/19/2009     ICD Implanting MD:  Sherryl Manges, MD  Lead 1:    Location: RA     DOI: 11/19/2009     Model #: [id number]     Serial #: [id number]     Status: active Lead 2:    Location: RV     DOI: 11/19/2009     Status: active  Indications::  ICM   ICD Follow Up Remote Check?  No Battery Voltage:  ok V     Charge Time:  8.3 seconds     Battery Est. Longevity:  8.5 years   ICD Device Measurements Atrium:  Amplitude: 5.3 mV, Impedance: 787 ohms, Threshold: 0.2 V at 0.4 msec Right Ventricle:  Amplitude: 19.9 mV, Impedance: 558 ohms, Threshold: 0.1 V at 0.4 msec Shock Impedance: 51 ohms   Episodes Shock:  0     ATP:  0     Nonsustained:  0     Atrial Pacing:  <1%     Ventricular Pacing:  <1%  Brady Parameters Mode DDD     Lower Rate Limit:  40     Upper Rate Limit 120 PAV 260     Sensed AV Delay:  260  Tachy Zones VF:  200     VT:  170     Tech Comments:  No parameter changes.  Device function normal. Device checked by industry for research.  Steri strips removed.  No redness or edema noted.  ROV 3 months with Dr. Graciela Husbands. Altha Harm, LPN  December 12, 2009 12:43 PM

## 2010-07-31 NOTE — Miscellaneous (Signed)
Summary: corrected device information  Clinical Lists Changes  Observations: Added new observation of ICDLEADSER2: 161096  (07/24/2010 18:32) Added new observation of ICDLEADMOD2: 0185  (07/24/2010 18:32) Added new observation of ICDLEADDOI2: 11/18/2009  (07/24/2010 18:32) Added new observation of ICDLEADDOI1: 11/18/2009  (07/24/2010 18:32) Added new observation of ICD IMPL DTE: 11/18/2009  (07/24/2010 18:32) Added new observation of ICD SERL#: [id number]  (07/24/2010 18:32)       ICD Specifications Following MD:  Sherryl Manges, MD     Referring MD:  Thurmon Fair, MD ICD Vendor:  [facility] Scientific     ICD Model Number:  E110     ICD Serial Number:  [id number] ICD DOI:  11/18/2009     ICD Implanting MD:  Sherryl Manges, MD Research Study Name MADIT - RIT  Lead 1:    Location: RA     DOI: 11/18/2009     Model #: [id number]     Serial #: [id number]     Status: active Lead 2:    Location: RV     DOI: 11/18/2009     Model #: 3086     Serial #: [id number]     Status: active  Indications::  ICM   ICD Follow Up ICD Dependent:  No      Episodes Coumadin:  Yes  Brady Parameters Mode DDD     Lower Rate Limit:  40     Upper Rate Limit 120 PAV 260     Sensed AV Delay:  260  Tachy Zones VF:  200     VT:  170
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
## 2010-07-31 NOTE — Assessment & Plan Note (Signed)
Summary: nep/ discuss icd placement/ pt is self pay/ health sever./ gdp   History of Present Illness: Marcia [name] is seen at the request of [facility] for consideration of ICD implantation.  She is a 60 year old woman who presented to hospital multiple times last summer with chest pain diagnosed variably as bronchitis and pneumonia and then more definitively in October when she was diagnosed as having coronary disease. She underwent catheterization and attempted PTCA of her LAD that was complicated by dissection; medical therapy was recommended. Ejection fraction at that time was 25% repeat ejection fraction last month was 30%. She is referred on that basis.  She has also had atrial fibrillation. This presented itself in October. She was treated with amiodarone and Coumadin. Her amiodarone dose was recently down titrated.  She also has congestive heart failure with dyspnea on exertion at less than 100 yard less than one flight of stairs. She has some recumbent dyspnea; she has not had peripheral edema but does have significant abdominal swelling that tracks with her weight. She was hospitalized last month for acute congestive heart failure associated with hypotension.   Current Medications (verified): 1)  Lisinopril 20 Mg Tabs (Lisinopril) .... 1/2 Tab By Mouth Two Times A Day 2)  Pacerone 200 Mg Tabs (Amiodarone Hcl) .... Take One Tablet By Once Daily 3)  Klor-Con M20 20 Meq Cr-Tabs (Potassium Chloride Crys Cr) .... Take 2 Tablets By Mouth Everyday 4)  Simvastatin 20 Mg Tabs (Simvastatin) .... 1/2  Tablet By Mouth Everyday 5)  Warfarin Sodium 5 Mg Tabs (Warfarin Sodium) .Marland Kitchen.. 1 Tab By Mouth Daily Except Thursday, Take 1 1/2 Tabs 6)  Metoprolol Tartrate 25 Mg Tabs (Metoprolol Tartrate) .Marland Kitchen.. 1 Tab By Mouth Two Times A Day 7)  Aspirin 325 Mg Tabs (Aspirin) .... 1/2 Tab By Mouth Daily 8)  Furosemide 40 Mg Tabs (Furosemide) .... Take One Tablet By Mouth Once Daily 9)  Vitamin B-12 500 Mcg Tabs  (Cyanocobalamin) 10)  Vitamin E 400 Unit Caps (Vitamin E) 11)  Zoloft 100 Mg Tabs (Sertraline Hcl) .Marland Kitchen.. 1 Tab By Mouth Daily 12)  Trazodone Hcl 50 Mg Tabs (Trazodone Hcl) .Marland Kitchen.. 1 - 2 Tabs By Mouth At Bedtime 13)  Proventil Hfa 108 (90 Base) Mcg/act Aers (Albuterol Sulfate) .... 2 Puffs Every 4 Hours As Needed 14)  Vitamin D 1000 Iu .Marland Kitchen.. 1 Tab By Mouth Daily 15)  Flonase 50 Mcg/act Susp (Fluticasone Propionate) .... 2 Sprays Each Nostril Once Daily  Allergies (verified): 1)  ! * Atrovent 2)  ! * Ambien 3)  ! * Flovent  Past History:  Past Medical History: Last updated: 10/30/2009 Coronary artery disease, status post prior left anterior descending       occlusion in October 2010, status post unsuccessful intervention       secondary to dissection. Large anterior scar by nuclear study.  Acute systolic heart failure Ischemic cardiomyopathy with an EF of 30-35% by echocardiogram on   September 30, 2009, with a prior ejection fraction of 20-25% by   catheterization in October 2010.   History of paroxysmal atrial fibrillation, on Coumadin      History of posttraumatic stress disorder/obsessive-compulsive       disorder/anxiety/depression Anemia, stable Tobacco use x40 years Rheumatoid arthritis  Family History: Last updated: 10/30/2009 Her mother has a history of diabetes and father has a  history of colon cancer.   Her sister has a history of asthma.   No CAD  that she knows of  Social History: Last updated:  10/30/2009 She smoked 2 pack per day for approximately 40 years  and has now cut down to 1/2 pack a day.   No alcohol or drug use per the patient including marijuana, heroin, or methamphetamines.   Review of Systems       full review of systems was negative apart from a history of present illness and past medical history except allergies constipation and fatigue.  She was recently diagnosed with OCD   Vital Signs:  Patient profile:   60 year old female Height:      66  inches Weight:      186 pounds Pulse rate:   63 / minute Pulse rhythm:   regular BP sitting:   127 / 84  (left arm) Cuff size:   large  Vitals Entered By: Judithe Modest CMA (Oct 30, 2009 3:40 PM)  Physical Exam  General:  Alert and oriented middle aged caucasina woman appearing older than her stated age in no acute distress. HEENT  normal . Neck veins were flat; carotids brisk and full without bruits. No lymphadenopathy. Back without kyphosis. Lungs clear. Heart sounds regular without murmurs; S4 is present. PMIdisplaced. Abdomen soft with active bowel sounds without midline pulsation or hepatomegaly. Femoral pulses and distal pulses intact. Extremities were without clubbing cyanosis or edemaSkin warm and dry. Neurological exam grossly normal; affect was labile    EKG  Procedure date:  10/30/2009  Findings:      sinus rhythm at 66 Intervals 0.16/2008/0.40 Axis is 80 There is total loss of R waves from V1 to V5 and in Q waves also in lead one and L. low voltage is noted  Impression & Recommendations:  Problem # 1:  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) The patient has ischemic heart disease with a total LAD and the specimen we can tell no other significant disease. (Catheter report is not in the chart) she has persistent depression of LV function. This is true despite medications. Other medication adjustments I could be considered would includes taking her metoprolol to carvedilol and the addition of Aldactone based on the Emphasis Trial.  However, she has persistent issues with LV function and with her class II to class III heart failure she is a candidate for ICD implantation.  we have discussed potential benefits as well as potential risks including but not limited to death perforation infection and inappropriate therapies. I've asked her to consider enrollment in MADIT-RIT ; she will be considering this.  Her QRS is narrow; hence she is not a candidate for CRT. Her  updated medication list for this problem includes:    Lisinopril 20 Mg Tabs (Lisinopril) .Marland Kitchen... 1/2 tab by mouth two times a day    Pacerone 200 Mg Tabs (Amiodarone hcl) .Marland Kitchen... Take one tablet by once daily    Warfarin Sodium 5 Mg Tabs (Warfarin sodium) .Marland Kitchen... 1 tab by mouth daily except thursday, take 1 1/2 tabs    Metoprolol Tartrate 25 Mg Tabs (Metoprolol tartrate) .Marland Kitchen... 1 tab by mouth two times a day    Aspirin 325 Mg Tabs (Aspirin) .Marland Kitchen... 1/2 tab by mouth daily    Furosemide 40 Mg Tabs (Furosemide) .Marland Kitchen... Take one tablet by mouth once daily  Orders: EKG w/ Interpretation (93000)  Problem # 2:  ATRIAL FIBRILLATION (ICD-427.31) She has atrial fibrillation and is currently on Pacerone. Amiodarone surveillance laboratories from April were within normal limits. It may be worth considering discontinuing her aspirin has as little of this point to her warfarin. I will defer  this to Dr. Salena Saner. Her updated medication list for this problem includes:    Pacerone 200 Mg Tabs (Amiodarone hcl) .Marland Kitchen... Take one tablet by once daily    Warfarin Sodium 5 Mg Tabs (Warfarin sodium) .Marland Kitchen... 1 tab by mouth daily except thursday, take 1 1/2 tabs    Metoprolol Tartrate 25 Mg Tabs (Metoprolol tartrate) .Marland Kitchen... 1 tab by mouth two times a day    Aspirin 325 Mg Tabs (Aspirin) .Marland Kitchen... 1/2 tab by mouth daily

## 2010-07-31 NOTE — Letter (Signed)
Summary: REFERRAL FORM  REFERRAL FORM   Imported By: Leodis Rains 07/29/2009 17:06:14  _____________________________________________________________________  External Attachment:    Type:   Image     Comment:   External Document

## 2010-07-31 NOTE — Letter (Signed)
Summary: HEALTH HISTORY FORM  HEALTH HISTORY FORM   Imported By: Leodis Rains 07/29/2009 17:07:01  _____________________________________________________________________  External Attachment:    Type:   Image     Comment:   External Document

## 2010-07-31 NOTE — Cardiovascular Report (Signed)
Summary: Pre Op Orders  Pre Op Orders   Imported By: Roderic Ovens 11/14/2009 11:47:30  _____________________________________________________________________  External Attachment:    Type:   Image     Comment:   External Document

## 2010-07-31 NOTE — Letter (Signed)
Summary: MAILED REQUESTED RECORDS TO [facility]  MAILED REQUESTED RECORDS TO [facility]   Imported By: Arta Bruce 07/19/2009 10:27:16  _____________________________________________________________________  External Attachment:    Type:   Image     Comment:   External Document

## 2010-07-31 NOTE — Assessment & Plan Note (Signed)
Summary: 2 month fu//kt   Vital Signs:  Patient profile:   60 year old female Weight:      181 pounds Temp:     98.3 degrees F Pulse rate:   76 / minute Pulse rhythm:   regular Resp:     20 per minute BP sitting:   98 / 64  (left arm) Cuff size:   regular  Vitals Entered By: Vesta Mixer CMA (January 14, 2010 2:02 PM) CC: 2 month f/u per Dr. Delrae Alfred.  Still has "the shakes" and "the depression", but has it under control Is Patient Diabetic? No Pain Assessment Patient in pain? yes     Location: jaw Intensity: 3  Does patient need assistance? Ambulation Normal   CC:  2 month f/u per Dr. Delrae Alfred.  Still has "the shakes" and "the depression" and but has it under control.  History of Present Illness: 1.  Status post cardiac defibrillator placed.  Following with Dr. Graciela Husbands at [facility] and [facility].  Doing well from her standpoint with this.    2.  Depression/Anxiety:  Still has not seen Georgeanne Nim"  Wants to know if I still want her to be seen.  Pt. has had episodes of crying, but is tired of being this way and is trying to pull herself out of it.    3.  Tremors:  now for months--can hardly write name.  Noted them the first time since maybe 2009--thought they were related to anxiety/panic attacks.  Seem to be worsening over time.  TSH in November was normal.  Pt. with difficulty saying if has at rest--does have with movement.  Has tremor mainly in right hand and head.  Not sure if anyone in her family has a history of tremor.  Her mother may have a bit of one.  Family keeps things like that to themselves.  4.  DM:  Nutrition needs newest labs.  Nutrition would like her to do cardiac rehab--cardiac apparently has tried to get her in as well.  Not sure why she has not--cannot get her to focus on reason.    Current Medications (verified): 1)  Lisinopril 20 Mg Tabs (Lisinopril) .... 1/2 Tab By Mouth Two Times A Day 2)  Pacerone 200 Mg Tabs (Amiodarone Hcl)  .... Take One Tablet By Once Daily 3)  Klor-Con M20 20 Meq Cr-Tabs (Potassium Chloride Crys Cr) .... Take 2 Tablets By Mouth Everyday 4)  Simvastatin 20 Mg Tabs (Simvastatin) .... 1/2  Tablet By Mouth Everyday 5)  Warfarin Sodium 5 Mg Tabs (Warfarin Sodium) .Marland Kitchen.. 1 Tab By Mouth Daily Except Thursday, Take 1 1/2 Tabs 6)  Metoprolol Tartrate 25 Mg Tabs (Metoprolol Tartrate) .Marland Kitchen.. 1 Tab By Mouth Two Times A Day 7)  Aspirin 325 Mg Tabs (Aspirin) .... 1/2 Tab By Mouth Daily 8)  Furosemide 40 Mg Tabs (Furosemide) .... Take One Tablet By Mouth Once Daily 9)  Vitamin B-12 500 Mcg Tabs (Cyanocobalamin) 10)  Vitamin E 400 Unit Caps (Vitamin E) 11)  Zoloft 100 Mg Tabs (Sertraline Hcl) .Marland Kitchen.. 1 Tab By Mouth Daily 12)  Trazodone Hcl 50 Mg Tabs (Trazodone Hcl) .... 2 Tabs By Mouth At Bedtime 13)  Proventil Hfa 108 (90 Base) Mcg/act Aers (Albuterol Sulfate) .... 2 Puffs Every 4 Hours As Needed 14)  Vitamin D 1000 Iu .Marland Kitchen.. 1 Tab By Mouth Daily 15)  Flonase 50 Mcg/act Susp (Fluticasone Propionate) .... 2 Sprays Each Nostril Once Daily  Allergies (verified): 1)  ! * Atrovent 2)  ! *  Ambien 3)  ! * Flovent  Physical Exam  General:  NAD--occasionally tearful, very anxious appearing. Lungs:  Normal respiratory effort, chest expands symmetrically. Lungs are clear to auscultation, no crackles or wheezes. Heart:  Normal rate and regular rhythm. S1 and S2 normal without gallop, murmur, click, rub or other extra sounds.  Radial pulses normal and equal   Impression & Recommendations:  Problem # 1:  TREMOR (ICD-781.0) Suspect an essential tremor--pt. states it was better when she took a friend's Valium Exacerbated by anxiety Orders: T-TSH ([id number])  Problem # 2:  HYPERGLYCEMIA, BORDERLINE (ICD-790.29)  Orders: Hgb A1C ([id number]) Hgb A1C ([id number])  Problem # 3:  Dental procedure Pt. gives me paper from dentist to fill out for a procedure tomorrow--extraction.  Coumadin already on hold Have called Dr.  Graciela Husbands to see if he recommends prophylaxis with recent defibrillator implantation.  Problem # 4:  DEPRESSION/ANXIETY (ICD-300.4) Continue meds--sleeping better now--feel she needs to continue with counseling--3rd attempt to get back to [facility]  Complete Medication List: 1)  Lisinopril 20 Mg Tabs (Lisinopril) .... 1/2 tab by mouth two times a day 2)  Pacerone 200 Mg Tabs (Amiodarone hcl) .... Take one tablet by once daily 3)  Klor-con M20 20 Meq Cr-tabs (Potassium chloride crys cr) .... Take 2 tablets by mouth everyday 4)  Simvastatin 20 Mg Tabs (Simvastatin) .... 1/2  tablet by mouth everyday 5)  Warfarin Sodium 5 Mg Tabs (Warfarin sodium) .Marland Kitchen.. 1 tab by mouth daily except thursday, take 1 1/2 tabs 6)  Metoprolol Tartrate 25 Mg Tabs (Metoprolol tartrate) .Marland Kitchen.. 1 tab by mouth two times a day 7)  Aspirin 325 Mg Tabs (Aspirin) .... 1/2 tab by mouth daily 8)  Furosemide 40 Mg Tabs (Furosemide) .... Take one tablet by mouth once daily 9)  Vitamin B-12 500 Mcg Tabs (Cyanocobalamin) 10)  Vitamin E 400 Unit Caps (Vitamin e) 11)  Zoloft 100 Mg Tabs (Sertraline hcl) .Marland Kitchen.. 1 tab by mouth daily 12)  Trazodone Hcl 50 Mg Tabs (Trazodone hcl) .... 2 tabs by mouth at bedtime 13)  Proventil Hfa 108 (90 Base) Mcg/act Aers (Albuterol sulfate) .... 2 puffs every 4 hours as needed 14)  Vitamin D 1000 Iu  .Marland Kitchen.. 1 tab by mouth daily 15)  Flonase 50 Mcg/act Susp (Fluticasone propionate) .... 2 sprays each nostril once daily  Patient Instructions: 1)  Please schedule follow up with Marchelle Folks Vaughn--anxiety 2)  Follow up with Dr. Delrae Alfred in 4 months   Appended Document: 2 month fu//kt Did talk with Dr. Myrtis Ser, Dr. Odessa Fleming partner:  recommended abx prophylaxis for 3 months after AICD placement--info called to dentist office and paper filled out and faxed.

## 2010-07-31 NOTE — Letter (Signed)
Summary: [facility]  [facility]   Imported By: Arta Bruce 07/03/2010 14:59:16  _____________________________________________________________________  External Attachment:    Type:   Image     Comment:   External Document

## 2010-07-31 NOTE — Progress Notes (Signed)
  Phone Note Outgoing Call   Summary of Call: Please call and let pt. know we currently do not have a podiatry clinic, but will try to get her in as soon as that is  back up and running.  Please make sure she has a follow up with me--if not, would like to see her back in 2 months or thereabout. Initial call taken by: Julieanne Manson MD,  July 17, 2009 11:39 AM  Follow-up for Phone Call        Pt aware and is also waiting on Medicaid, will let us know when and if she gets approved and we can refer to an outside podiatrist.  She also already has a f/u appt scheduled. Follow-up by: Vesta Mixer CMA,  July 17, 2009 3:41 PM
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
## 2010-07-31 NOTE — Assessment & Plan Note (Signed)
Summary: Acute - Right Great toe injury   Vital Signs:  Patient profile:   60 year old female Height:      66 inches Weight:      192 pounds BMI:     31.10 Temp:     96.8 degrees F oral Pulse rate:   80 / minute Pulse rhythm:   regular Resp:     18 per minute BP sitting:   110 / 80  (left arm) Cuff size:   regular  Vitals Entered By: [place] Shannon (March 10, 2010 9:13 AM)  Nutrition Counseling: Patient's BMI is greater than 25 and therefore counseled on weight management options. CC: pt is here for right big toe pain...Marland KitchenMarland Kitchen pt says she dropped a heavy board on her toe...Marland KitchenMarland Kitchen pt says she has been icing toe.... pt says it happen last night... pt says she has appt with section 8 tomorrow and would like to know if she can have something for pain... Is Patient Diabetic? No Pain Assessment Patient in pain? no       Does patient need assistance? Functional Status Self care Ambulation Normal   CC:  pt is here for right big toe pain...Marland KitchenMarland Kitchen pt says she dropped a heavy board on her toe...Marland KitchenMarland Kitchen pt says she has been icing toe.... pt says it happen last night... pt says she has appt with section 8 tomorrow and would like to know if she can have something for pain....  History of Present Illness:  Pt into the office to f/u on trauma to right foot injury. She reports that she was building a book shelf on yesterday and a board fell on her foot. 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 Pt is well aware of buddy taping and icing.  Pt is not currently on any pain medications.  Pt wants provider to review medication list on today and confirm all that she is taking and that none of them are narcotics. Pt is on coumadin and last INR was 2.4 done on 01/09/2010 (records presented from pt from [facility])  pt reports that she is in excruciating pain.  Pain level is 9. She does have an appointment on tomorrow to apply  for [employer].  She reports that she will be required to stand for at least 3-4 hours in line which she reports she is not able to do with current injury. She has already downsized her living quarters and is hoping that she can stay in her current dwelling.  Current Medications (verified): 1)  Lisinopril 20 Mg Tabs (Lisinopril) .... 1/2 Tab By Mouth Two Times A Day 2)  Pacerone 200 Mg Tabs (Amiodarone Hcl) .... Take One Tablet By Once Daily 3)  Klor-Con M20 20 Meq Cr-Tabs (Potassium Chloride Crys Cr) .... Take 2 Tablets By Mouth Everyday 4)  Simvastatin 20 Mg Tabs (Simvastatin) .... 1/2  Tablet By Mouth Everyday 5)  Warfarin Sodium 5 Mg Tabs (Warfarin Sodium) .Marland Kitchen.. 1 Tab By Mouth Daily Except Thursday, Take 1 1/2 Tabs 6)  Metoprolol Tartrate 25 Mg Tabs (Metoprolol Tartrate) .Marland Kitchen.. 1 Tab By Mouth Two Times A Day 7)  Aspirin 325 Mg Tabs (Aspirin) .... 1/2 Tab By Mouth Daily 8)  Furosemide 40 Mg Tabs (Furosemide) .... Take One Tablet By Mouth Once Daily 9)  Vitamin B-12 500 Mcg Tabs (Cyanocobalamin) 10)  Vitamin E 400 Unit Caps (Vitamin E) 11)  Zoloft 100 Mg Tabs (Sertraline Hcl) .Marland Kitchen.. 1 Tab  By Mouth Daily 12)  Trazodone Hcl 50 Mg Tabs (Trazodone Hcl) .... 2 Tabs By Mouth At Bedtime 13)  Proventil Hfa 108 (90 Base) Mcg/act Aers (Albuterol Sulfate) .... 2 Puffs Every 4 Hours As Needed 14)  Vitamin D 1000 Iu .Marland Kitchen.. 1 Tab By Mouth Daily 15)  Flonase 50 Mcg/act Susp (Fluticasone Propionate) .... 2 Sprays Each Nostril Once Daily  Allergies (verified): 1)  ! * Atrovent 2)  ! * Ambien 3)  ! * Flovent  Review of Systems MS:  Complains of joint pain; right great toe.  Physical Exam  General:  alert.   Head:  normocephalic.   Msk:  bilateral bunions right great toe - bruising to DIP- unable to bend at that joint decreased movement of PIP +swelling Neurologic:  alert & oriented X3.   Psych:  somewhat anxious   Impression & Recommendations:  Problem # 1:  FOOT INJURY, RIGHT (ICD-959.7) advised  pt that if x-ray is done it may show fracture but treatment will likely be the same s/p trauma Advised pt to buddy tape and apply ice, elevation will given 10 days of pain meds - advised pt this is a one time Rx  Complete Medication List: 1)  Lisinopril 20 Mg Tabs (Lisinopril) .... 1/2 tab by mouth two times a day 2)  Pacerone 200 Mg Tabs (Amiodarone hcl) .... Take one tablet by once daily 3)  Klor-con M20 20 Meq Cr-tabs (Potassium chloride crys cr) .... Take 2 tablets by mouth everyday 4)  Simvastatin 20 Mg Tabs (Simvastatin) .... 1/2  tablet by mouth everyday 5)  Warfarin Sodium 5 Mg Tabs (Warfarin sodium) .Marland Kitchen.. 1 tab by mouth daily except thursday, take 1 1/2 tabs 6)  Metoprolol Tartrate 25 Mg Tabs (Metoprolol tartrate) .Marland Kitchen.. 1 tab by mouth two times a day 7)  Aspirin 325 Mg Tabs (Aspirin) .... 1/2 tab by mouth daily 8)  Furosemide 40 Mg Tabs (Furosemide) .... Take one tablet by mouth once daily 9)  Vitamin B-12 500 Mcg Tabs (Cyanocobalamin) 10)  Vitamin E 400 Unit Caps (Vitamin e) 11)  Zoloft 100 Mg Tabs (Sertraline hcl) .Marland Kitchen.. 1 tab by mouth daily 12)  Trazodone Hcl 50 Mg Tabs (Trazodone hcl) .... 2 tabs by mouth at bedtime 13)  Proventil Hfa 108 (90 Base) Mcg/act Aers (Albuterol sulfate) .... 2 puffs every 4 hours as needed 14)  Vitamin D 1000 Iu  .Marland Kitchen.. 1 tab by mouth daily 15)  Flonase 50 Mcg/act Susp (Fluticasone propionate) .... 2 sprays each nostril once daily 16)  Vicodin 5-500 Mg Tabs (Hydrocodone-acetaminophen) .... One tablet by mouth two times a day two times a day for pain  Patient Instructions: 1)  Buddy tape 1st and 2nd toes on right foot 2)  Elevate 3)  Apply Ice 4)  May take vicodin as needed two times a day for pain. 5)  This is a one time prescription 6)  Follow up in 2 weeks with Dr. Delrae Alfred for right great toe. Prescriptions: VICODIN 5-500 MG TABS (HYDROCODONE-ACETAMINOPHEN) One tablet by mouth two times a day two times a day for pain  #20 x 0   Entered and  Authorized by:   Lehman Prom FNP   Signed by:   Lehman Prom FNP on 03/10/2010   Method used:   Print then Give to Patient   RxID:   [id number]

## 2010-07-31 NOTE — Procedures (Signed)
Summary: pcp. gd   Allergies: 1)  ! * Atrovent 2)  ! * Ambien 3)  ! * Flovent   ICD Specifications Following MD:  Sherryl Manges, MD     Referring MD:  Thurmon Fair, MD ICD Vendor:  [facility] [employer]     ICD Model Number:  [phone]     ICD Serial Number:  [id number] ICD DOI:  11/19/2009     ICD Implanting MD:  Sherryl Manges, MD Research Study Name MADIT - RIT  Lead 1:    Location: RA     DOI: 11/19/2009     Model #: [id number]     Serial #: [id number]     Status: active Lead 2:    Location: RV     DOI: 11/19/2009     Status: active  Indications::  ICM   ICD Follow Up Remote Check?  No Charge Time:  8.6 seconds     Battery Est. Longevity:  10.5 years ICD Dependent:  No       ICD Device Measurements Atrium:  Amplitude: 2.4 mV, Impedance: 736 ohms, Threshold: 0.7 V at 0.4 msec Right Ventricle:  Amplitude: 23.1 mV, Impedance: 646 ohms, Threshold: 0.6 V at 0.4 msec Shock Impedance: 55 ohms   Episodes MS Episodes:  0     Percent Mode Switch:  0     Coumadin:  Yes Shock:  0     ATP:  0     Nonsustained:  0     Atrial Pacing:  <1%     Ventricular Pacing:  <1%  Brady Parameters Mode DDD     Lower Rate Limit:  40     Upper Rate Limit 120 PAV 260     Sensed AV Delay:  260  Tachy Zones VF:  200     VT:  170     Tech Comments:  Checked by Media planner for research.  Follow up per research schedule. Altha Harm, LPN  June 26, 2010 4:45 PM

## 2010-07-31 NOTE — Assessment & Plan Note (Signed)
Summary: FRACTURED TOE, BUT NOW IT IS TURNING BLACK/LR   Vital Signs:  Patient profile:   60 year old female Menstrual status:  postmenopausal Height:      66 inches Weight:      198.8 pounds Temp:     97.0 degrees F oral Pulse rate:   70 / minute Pulse rhythm:   regular Resp:     20 per minute BP sitting:   102 / 68  (left arm) Cuff size:   regular  Vitals Entered By: Michelle Nasuti (March 13, 2010 10:21 AM) CC: R GREAT TOE FX X 4 DAYS AGO, L LOWER EXTREMITY EDEMA AND BACK PAIN Pain Assessment Patient in pain? yes      Intensity: 8  Does patient need assistance? Ambulation Normal     Menstrual Status postmenopausal   CC:  R GREAT TOE FX X 4 DAYS AGO and L LOWER EXTREMITY EDEMA AND BACK PAIN.  History of Present Illness: 1.  4 days ago dropped a heavy board on right great toe when cleaning up from a yard sale.  Was seen here 3 days ago and presumed to be fractured.  Has been buddy taping.  Coloring of toe and swelling is improved.  Still quite tender.    2.  Left foot and ankle swelled yesterday "like a balloon" to the knee.  Right leg without swelling.  Has had the left leg swell on itself before--only once.  Has a bad knee on the left as well.  Similar circumstances caused the left leg to swell before-- had a yard sale, was on feet a lot and was favoring right leg for other reasons.  Has been using sodium more in past week--gained 11 lbs and had not been taking extra Furosemide.    3.  Low back pain:  Since yard sale this past weekend.  No radicular pain.  Has been using Aspercream topically without much improvement.  Has been using the prescription given Monday at 2 tabs two times a day -- only has 3 left.      4.  Proventil does not work as well as the Ventolin--seems to have a weaker spray.  5.  Ischemic Cardiomyopathy:  Amiodarone increased by Dr. Salena Saner, cardiology 2 days ago.  Per pt., plans to titrate up as tolerated.   Habits &  Providers  Alcohol-Tobacco-Diet     Tobacco Status: current     Cigarette Packs/Day: 0.5  Current Medications (verified): 1)  Lisinopril 20 Mg Tabs (Lisinopril) .... 1/2 Tab By Mouth Two Times A Day 2)  Pacerone 200 Mg Tabs (Amiodarone Hcl) .... Take One Tablet By Once Daily 3)  Klor-Con M20 20 Meq Cr-Tabs (Potassium Chloride Crys Cr) .... Take 2 Tablets By Mouth Everyday 4)  Simvastatin 20 Mg Tabs (Simvastatin) .... 1/2  Tablet By Mouth Everyday 5)  Warfarin Sodium 5 Mg Tabs (Warfarin Sodium) .Marland Kitchen.. 1 Tab By Mouth Daily Except Thursday, Take 1 1/2 Tabs 6)  Metoprolol Tartrate 25 Mg Tabs (Metoprolol Tartrate) .Marland Kitchen.. 1 Tab By Mouth Two Times A Day 7)  Aspirin 325 Mg Tabs (Aspirin) .... 1/2 Tab By Mouth Daily 8)  Furosemide 40 Mg Tabs (Furosemide) .... Take One Tablet By Mouth Once Daily 9)  Vitamin B-12 500 Mcg Tabs (Cyanocobalamin) 10)  Vitamin E 400 Unit Caps (Vitamin E) 11)  Zoloft 100 Mg Tabs (Sertraline Hcl) .Marland Kitchen.. 1 Tab By Mouth Daily 12)  Trazodone Hcl 50 Mg Tabs (Trazodone Hcl) .... 2 Tabs By Mouth At Bedtime 13)  Proventil Hfa 108 (90 Base) Mcg/act Aers (Albuterol Sulfate) .... 2 Puffs Every 4 Hours As Needed 14)  Vitamin D 1000 Iu .Marland Kitchen.. 1 Tab By Mouth Daily 15)  Flonase 50 Mcg/act Susp (Fluticasone Propionate) .... 2 Sprays Each Nostril Once Daily 16)  Vicodin 5-500 Mg Tabs (Hydrocodone-Acetaminophen) .... One Tablet By Mouth Two Times A Day Two Times A Day For Pain  Allergies (verified): 1)  ! * Atrovent 2)  ! * Ambien 3)  ! * Flovent  Social History: Packs/Day:  0.5  Physical Exam  General:  Sleepy--states just woke up. Lungs:  Bibasilar rhonchi--minimal, scant end expiratory wheeze.  Rest clear with good air movement. Heart:  Normal rate and regular rhythm today. S1 and S2 normal without gallop, murmur, click, rub or other extra sounds.  Radial pulses normal and equal Msk:  Tender over right paraspinous musculature more than left. Extremities:  Right great toe with mild  swelling and contusion. Right ankle with scant edema. Left leg to below knee with mild-moderate edema.  No erythema.  No obvious tenderness on palpation.   Impression & Recommendations:  Problem # 1:  FOOT INJURY, RIGHT (ICD-959.7) improving--to keep buddy taped  Problem # 2:  LEG EDEMA (ICD-782.3) left worse than right--to take increased Furosemide as needed--extra potassium on those days as well Her updated medication list for this problem includes:    Furosemide 40 Mg Tabs (Furosemide) .Marland Kitchen... Take one tablet by mouth once daily.  may take another tab daily if increased edema as needed  Problem # 3:  BACK PAIN, LUMBAR (ICD-724.2) Extend vicodin Her updated medication list for this problem includes:    Aspirin 325 Mg Tabs (Aspirin) .Marland Kitchen... 1/2 tab by mouth daily    Vicodin 5-500 Mg Tabs (Hydrocodone-acetaminophen) ..... 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  Complete Medication List: 1)  Lisinopril 20 Mg Tabs (Lisinopril) .... 1/2 tab by mouth two times a day 2)  Pacerone 200 Mg Tabs (Amiodarone hcl) .... Take one tablet by once daily 3)  Klor-con M20 20 Meq Cr-tabs (Potassium chloride crys cr) .... Take 2 tablets by mouth everyday.  take 1 extra tablet daily if take and extra furosemide that day 4)  Simvastatin 20 Mg Tabs (Simvastatin) .... 1/2  tablet by mouth everyday 5)  Warfarin Sodium 5 Mg Tabs (Warfarin sodium) .Marland Kitchen.. 1 tab by mouth daily except thursday, take 1 1/2 tabs 6)  Metoprolol Tartrate 25 Mg Tabs (Metoprolol tartrate) .Marland Kitchen.. 1 tab by mouth two times a day 7)  Aspirin 325 Mg Tabs (Aspirin) .... 1/2 tab by mouth daily 8)  Furosemide 40 Mg Tabs (Furosemide) .... Take one tablet by mouth once daily.  may take another tab daily if increased edema as needed 9)  Vitamin B-12 500 Mcg  Tabs (Cyanocobalamin) 10)  Vitamin E 400 Unit Caps (Vitamin e) 11)  Zoloft 100 Mg Tabs (Sertraline hcl) .Marland Kitchen.. 1 tab by mouth daily 12)  Trazodone Hcl 50 Mg Tabs (Trazodone hcl) .... 2 tabs by mouth at bedtime 13)  Ventolin Hfa 108 (90 Base) Mcg/act Aers (Albuterol sulfate) .... 2 puffs every 4 hours as needed for dyspnea 14)  Vitamin D 1000 Iu  .Marland Kitchen.. 1 tab by mouth daily 15)  Flonase 50 Mcg/act Susp (Fluticasone propionate) .... 2 sprays each nostril once  daily 16)  Vicodin 5-500 Mg Tabs (Hydrocodone-acetaminophen) .... One tablet by mouth two times a day two times a day for pain  Patient Instructions: 1)  Keep appt. with Dr. Delrae Alfred on the 27th of Sept Prescriptions: VICODIN 5-500 MG TABS (HYDROCODONE-ACETAMINOPHEN) One tablet by mouth two times a day two times a day for pain  #30 x 0   Entered and Authorized by:   Julieanne Manson MD   Signed by:   Julieanne Manson MD on 03/13/2010   Method used:   Print then Give to Patient   RxID:   [id number] KLOR-CON M20 20 MEQ CR-TABS (POTASSIUM CHLORIDE CRYS CR) take 2 tablets by mouth everyday.  Take 1 extra tablet daily if take and extra Furosemide that day  #75 x 11   Entered and Authorized by:   Julieanne Manson MD   Signed by:   Julieanne Manson MD on 03/13/2010   Method used:   Electronically to        [facility] (retail)       [address]       [place], [place]  [id number]       Ph: [phone]       Fax: [phone]   RxID:   [id number] FUROSEMIDE 40 MG TABS (FUROSEMIDE) take one tablet by mouth once daily.  May take another tab daily if increased edema as needed  #45 x 11   Entered and Authorized by:   Julieanne Manson MD   Signed by:   Julieanne Manson MD on 03/13/2010   Method used:   Electronically to        [facility] [address] (retail)       [address]       [place], [place]  [address]       Ph: [phone]       Fax: [phone]   RxID:    [id number] VENTOLIN HFA 108 (90 BASE) MCG/ACT AERS (ALBUTEROL SULFATE) 2 puffs every 4 hours as needed for dyspnea  #1 x 4   Entered and Authorized by:   Julieanne Manson MD   Signed by:   Julieanne Manson MD on 03/13/2010   Method used:   Electronically to        [facility] (retail)       [address]       [place], [place]  [id number]       Ph: [phone]       Fax: [phone]   RxID:   [id number]

## 2010-07-31 NOTE — Letter (Signed)
Summary: [facility]  [facility]   Imported By: Arta Bruce 05/28/2010 16:17:50  _____________________________________________________________________  External Attachment:    Type:   Image     Comment:   External Document

## 2010-07-31 NOTE — Progress Notes (Signed)
Summary: BAD COLD,CONGESTED,CHEST SORE  Phone Note Call from Patient Call back at Home Phone [phone]   Reason for Call: Acute Illness Summary of Call: Marcia [name] [name] HAS COLD. HER NOSE IS STUFFY, GLANDS ARE SWOLLEN UNDER HER CHIN AREA AND SHE FEELS KNOTS UNDER HER CHIN, AND HER BREAST PLATE IN HER CHEST IS SORE. SHE WANTS TO KNOW WHAT SHE CAN TAKE BECAUSE SHE HAS COPD AND, HEART DISEASE AND IS ON 13 DIFFERENT MEDS AND SHE WANTS TO BE CAREFUL WHAT SHE CAN AND CAN'T TAKE.  Initial call taken by: Leodis Rains,  July 01, 2009 8:22 AM  Follow-up for Phone Call        Starting Saturday she is having some swollen glands and it is worse in the morning.  She drinks some hot tea and it seems to help.  She is having stuffy nose, teeth sore, breast bone sore.  No cough or fever.  She has an appt on Wednesday and is feeling some better today, wants to know if she can take Thera Flu. Follow-up by: Vesta Mixer CMA,  July 01, 2009 4:23 PM  Additional Follow-up for Phone Call Additional follow up Details #1::        Pt here for ov. Additional Follow-up by: Mikey College CMA,  July 03, 2009 9:37 AM

## 2010-07-31 NOTE — Letter (Signed)
Summary: Generic Letter  [facility]  [address] [place], [place] [address]   Phone: [phone]  Fax: [phone]    10/12/2009  Re:  Marcia [name]      [address]      [place], [place]  [address]  To Whom It May Concern:  Ms. Marcia [name] has significant health concerns that make it very difficult for her to sit for jury duty.  Please excuse her for medical reasons.  If you need more information, please do not hesitate to call my office.    Sincerely,   Julieanne Manson MD

## 2010-07-31 NOTE — Letter (Signed)
Summary: *HSN Results Follow up  [facility]  [address] [place], [place] [address]   Phone: [phone]  Fax: [phone]      01/25/2010   PAYTAN RECINE [address] [place], [place]  [address]   Dear  Ms. Casy Litts,                            ____S.Drinkard,FNP   ____D. Gore,FNP       ____B. McPherson,MD   ____V. Rankins,MD    __X__E. Tonio Seider,MD    ____N. Daphine Deutscher, FNP  ____D. Reche Dixon, MD    ____K. Philipp Deputy, MD    ____Other     This letter is to inform you that your recent test(s):  _______Pap Smear    ___X____Lab Test     _______X-ray    ___X____ is within acceptable limits  _______ requires a medication change  _______ requires a follow-up lab visit  _______ requires a follow-up visit with your provider   Comments:  Your thyroid testing was okay and your A1C (testing for blood sugars over past 3-4 months)  was back in high normal range.  You still need to consider yourself prediabetic and work on a healthy diet.       _________________________________________________________ If you have any questions, please contact our office                     Sincerely,  Julieanne Manson MD [facility]

## 2010-07-31 NOTE — Letter (Signed)
Summary: [facility] Office Note  [facility] Office Note   Imported By: Roderic Ovens 12/24/2009 15:34:45  _____________________________________________________________________  External Attachment:    Type:   Image     Comment:   External Document

## 2010-07-31 NOTE — Cardiovascular Report (Signed)
Summary: Pre Op Orders   Pre Op Orders   Imported By: Roderic Ovens 12/16/2009 14:11:21  _____________________________________________________________________  External Attachment:    Type:   Image     Comment:   External Document

## 2010-07-31 NOTE — Progress Notes (Signed)
Summary: RELEASED FROM HOSP YESTERDAY  Phone Note Call from Patient Call back at [facility] Phone [phone]   Summary of Call: MULBERRY PT MS Bilyk CALLED AND WANTS TO LET YOU KNOW THAT SHE WAS JUST RELEASED FROM [facility] YESTERDAY. SHE WANTS TO LET YOU KNOW THAT THEY GAVE HER 2 NEW SCRIPTS  AND WANTS TO KNOW IF SHE SHOULD COME AND SEE YOU ABOUT THE CHANGES FROM THE HOSPITAL AND THEY TOLD HER TO SEE THE CARDIOLOGIST ON 10/07.  SHE SAYS SHE WOULD LIKE YOUR OPINION AS TO WHAT SHE SHOULD DO FIRST. Initial call taken by: Leodis Rains,  March 20, 2010 4:15 PM  Follow-up for [facility] discharge report on your chair.  Dutch Quint RN  March 20, 2010 5:26 PM   Additional Follow-up for Phone Call Additional follow up Details #1::        She should follow up with Dr. Royann Shivers as they directed.  I checked out her new meds and do not see a problem. She should keep her appt. with me on the 27th  Additional Follow-up by: Julieanne Manson MD,  March 20, 2010 7:03 PM    Additional Follow-up for Phone Call Additional follow up Details #2::    SHARED INFO WITH MS Mastel, ABOUT HER KEEPING HER APPT. AND FOLLOWING UP WITH HER OTHER DR. Follow-up by: Leodis Rains,  March 21, 2010 12:49 PM  New/Updated Medications: IMDUR 30 MG XR24H-TAB (ISOSORBIDE MONONITRATE) 1 tab by mouth daily--03/16/10 PROTONIX 40 MG TBEC (PANTOPRAZOLE SODIUM) 1 tab by mouth daily ASPIRIN 325 MG TABS (ASPIRIN) 1/2 tab by mouth daily
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
## 2010-07-31 NOTE — Letter (Signed)
Summary: Implantable Device Instructions  [facility], Main Office  [address]   [place], [place] [address]   Phone: [phone]  Fax: [phone]      Implantable Device Instructions  You are scheduled for:  _____ Permanent Transvenous Pacemaker ___x__ Implantable Cardioverter Defibrillator _____ Implantable Loop Recorder _____ Generator Change  on _5/23/2011____ with Dr. _Klein____.  1.  Please arrive at the Short Stay Center at [facility] at _10 am____ on the day of your procedure.  2.  Do not eat or drink the night before your procedure.  3.  Complete lab work on _5/19_in the am___.  The lab at [facility] is open from 8:30 AM to 1:30 PM and from 2:30 PM to 5:00 PM.  The lab at [facility] is open from 7:30 AM to 5:30 PM.  You do not have to be fasting.  4.  Do NOT take these medications for _5___ days prior to your procedure:  _________________________.  Take your last dose of Coumadin on __5/17______.  5.  Plan for an overnight stay.  Bring your insurance cards and a list of your medications.  6.  Wash your chest and neck with antibacterial soap (any brand) the evening before and the morning of your procedure.  Rinse well.  7.  Education material received:     Pacemaker _____           ICD __x___           Arrhythmia _____  *If you have ANY questions after you get home, please call the office [phone].  *Every attempt is made to prevent procedures from being rescheduled.  Due to the nauture of Electrophysiology, rescheduling can happen.  The physician is always aware and directs the staff when this occurs.

## 2010-07-31 NOTE — Progress Notes (Signed)
Summary: BROKE FOOT IN  2 PLACES  Phone Note Call from Patient Call back at Home Phone [phone]   Reason for Call: Talk to Nurse Summary of Call: MULBERRY PT. Marcia [name] WENT TO [facility] YESTERDAY, AND SHE BROKE HER FOOT IN 2 PLACES, THEY GAVE HER ENOUGH PAIN MEDS FOR A COUPLE OF DAYS, AND SHE SAYS THE DR ONLY SPENT 2 MINUTES W/HER AND THEY DIDN'T EVEN GIVE HER CRUTCHES, AND TOLD HER TO FU BUT DIDNT SAYS WITH WHO. SHE SAYS SHE IS ALREADY SCHEDULE TO SEE YOUNON 12/08 BUT SHE WANTS YOUR OPINION. SHE WASNT PLEASED WITH HER CARE THERE. Initial call taken by: Leodis Rains,  May 27, 2010 12:55 PM  Follow-up for Phone Call        Pt. seen @ Urgent Care 05/26/10 Fx  base of lt fifth metatarsal, sprain lt ankle. Currently using a cast boot having pain and swelling. Dicussed RICE pt verbalized understanding. Has appt. with you on 06/05/10. Was given Norco 5mg -325mg  tab. #15 one tablet Q6h as needed pain. She is requesting more medication.  Follow-up by: Gaylyn Cheers RN,  May 27, 2010 4:27 PM  Additional Follow-up for Phone Call Additional follow up Details #1::        Marcia [name] CALLED BACK AND SAYS THAT SHE IS IN A LOT OF PAIN, SHE IS OUT OF HER MEDS. YOU CAN ALL HER BACK AT [phone] Additional Follow-up by: Leodis Rains,  May 28, 2010 12:47 PM  New Problems: CLOSED FRACTURE OF METATARSAL BONE (ICD-825.25)   Additional Follow-up for Phone Call Additional follow up Details #2::    Is very upset that she hasn't heard back -- is burning with pain.  Wants to know what can be done.  Sent to N. Daphine Deutscher.  Dutch Quint RN  May 28, 2010 4:09 PM  Is the ER/hospital report on my desk for review [email or web address] May 28, 2010  4:50 PM  On your desk.  Dutch Quint RN  May 29, 2010 10:56 AM   Additional Follow-up for Phone Call Additional follow up Details #3:: Details for Additional Follow-up Action Taken: Looks like she was given 15 tablets on 05/26/2010 - today  is 05/29/2010 if she is out already then she is taking ??? 5 tabs per day I see that pt does have a fracture of her 5th toe as per x-ray She was advised pt make an appt with ortho (Dr. Carola Frost at [phone]) Did she make this appt? Will given Rx for Norco as the weekend is approaching (rx in basket) it appears that she is taking coumadin so I would not advise any NSAIDS n.martin,fnp May 29, 2010  2:59 PM  Advised of Marcia [name]'s response and new Rx.  Faxed to [facility] as requested.  States she did not make appt. with Dr. Carola Frost -- was to see him "if necessary" and was advised to wait until Dr. Delrae Alfred sees her on the 8th to see if ortho appt. is necessary.  Confirmed appt. for 06/05/10.  Dutch Quint RN  May 29, 2010 3:18 PM   New Problems: CLOSED FRACTURE OF METATARSAL BONE (ICD-825.25) New/Updated Medications: NORCO 5-325 MG TABS (HYDROCODONE-ACETAMINOPHEN) One tablet by mouth two times a day as needed for pain Prescriptions: NORCO 5-325 MG TABS (HYDROCODONE-ACETAMINOPHEN) One tablet by mouth two times a day as needed for pain  #20 x 0   Entered and Authorized by:   Lehman Prom FNP   Signed by:   Lehman Prom FNP on  05/29/2010   Method used:   Printed then faxed to ...       [facility] (retail)       [address] [place], [place]  [address]       Ph: [phone]       Fax: [phone]   RxID:   [id number]    X-ray  Procedure date:  05/26/2010  Findings:      ankle - fracture through the base of the left fifth metatarsal

## 2010-07-31 NOTE — Letter (Signed)
Summary: NUTRITION & DIABETES/FOLLOW UP NOTE  NUTRITION & DIABETES/FOLLOW UP NOTE   Imported By: Arta Bruce 03/18/2010 10:53:38  _____________________________________________________________________  External Attachment:    Type:   Image     Comment:   External Document

## 2010-07-31 NOTE — Progress Notes (Signed)
Summary: ortho referral  Phone Note Outgoing Call   Summary of Call: Nora--referral to ortho--see order, letter Initial call taken by: Julieanne Manson MD,  June 05, 2010 3:12 PM  Follow-up for Phone Call        PT HAS AN APPT 06-16-10 @ 8:45AM ORTHOPEDIC TRUAMA SPECIALIST  LVM TO PT TO RETURN MY CALL  Follow-up by: Cheryll Dessert,  June 11, 2010 12:12 PM

## 2010-07-31 NOTE — Miscellaneous (Signed)
Summary: Device preload  Clinical Lists Changes  Observations: Added new observation of ICD INDICATN: ICM (12/02/2009 12:04) Added new observation of ICDLEADLOC2: RV (12/02/2009 12:04) Added new observation of ICDLEADSTAT2: active (12/02/2009 12:04) Added new observation of ICDLEADSTAT1: active (12/02/2009 12:04) Added new observation of ICDLEADSER1: [id number]  (12/02/2009 12:04) Added new observation of ICDLEADMOD1: 4136  (12/02/2009 12:04) Added new observation of ICDLEADLOC1: RA  (12/02/2009 12:04) Added new observation of ICD IMP MD: Sherryl Manges, MD  (12/02/2009 12:04) Added new observation of ICDLEADDOI2: 11/19/2009  (12/02/2009 12:04) Added new observation of ICDLEADDOI1: 11/19/2009  (12/02/2009 12:04) Added new observation of ICD IMPL DTE: 11/19/2009  (12/02/2009 12:04) Added new observation of ICD SERL#: 25366  (12/02/2009 12:04) Added new observation of ICD MODL#: E110  (12/02/2009 12:04) Added new observation of ICDMANUFACTR: [employer]  (12/02/2009 12:04) Added new observation of IDC REFER MD: Thurmon Fair, MD  (12/02/2009 12:04) Added new observation of ICD MD: Sherryl Manges, MD  (12/02/2009 12:04)       ICD Specifications Following MD:  Sherryl Manges, MD     Referring MD:  Thurmon Fair, MD ICD Vendor:  [facility] [employer]     ICD Model Number:  E110     ICD Serial Number:  [id number] ICD DOI:  11/19/2009     ICD Implanting MD:  Sherryl Manges, MD  Lead 1:    Location: RA     DOI: 11/19/2009     Model #: [id number]     Serial #: [id number]     Status: active Lead 2:    Location: RV     DOI: 11/19/2009     Status: active  Indications::  ICM

## 2010-07-31 NOTE — Progress Notes (Signed)
Summary: inhaler concerns  Phone Note Call from Patient Call back at Home Phone [phone]   Summary of Call: pt is calling and stated that she got her new inhaler Friday(Atrovent) and it makes her chest hurt. pt states she is allergic to flovent and it made her breathe short and she was out of breath. She would prefer to have her ventolin inhaler back for emergencies if possible because it works.Marland KitchenMarland KitchenIF someone can run by provider today and get back with her... pt is aware that Delrae Alfred is out of office..patient uses Diplomatic Services operational officer... Initial call taken by: Mikey College CMA,  August 19, 2009 12:51 PM  Follow-up for Phone Call        proventil (which is albuterol) sent to the pharmacy on 08/02/2009 Did pt get this? I don't see atrovent as stated in beginning of this note so not sure where pt got this medication Follow-up by: Lehman Prom FNP,  August 19, 2009 3:37 PM  Additional Follow-up for Phone Call Additional follow up Details #1::        Left message on answering machine for pt to return call at [phone]. Additional Follow-up by: Vesta Mixer CMA,  August 20, 2009 9:22 AM    Additional Follow-up for Phone Call Additional follow up Details #2::    Pt was on atrovent, but d/c'd in Dec 2010.  She had not picked up proventil yet.  I called [facility] and it is ready for her and they will remove Atrovent from her profile.  Pt aware and will pick up proventil. Follow-up by: Vesta Mixer CMA,  August 20, 2009 9:39 AM

## 2010-07-31 NOTE — Progress Notes (Signed)
Summary: med refills  Phone Note Call from Patient Call back at Home Phone [phone] Call back at [phone]   Summary of Call: Pt needs more medical refills from albuterol,,furosimide and in reference to nasacort she states that medicaid won't covered so she needs another medication that can be suplanted from nasacort. [facility] [place] [phone]).  Pt just got medicaid # [id number] [place] MD Initial call taken by: Manon Hilding,  October 21, 2009 2:43 PM  Follow-up for Phone Call        Pt requestigng refill of her albuterol, lasix and needs a something else in place of the Nasocort due to it not being coverd by Medicaid.  [facility]. Follow-up by: Vesta Mixer CMA,  October 21, 2009 3:15 PM  Additional Follow-up for Phone Call Additional follow up Details #1::        Left message on answering machine for pt to return call to let her know meds sent to [facility] and nasocort changed to Flonase. Additional Follow-up by: Vesta Mixer CMA,  October 22, 2009 11:01 AM    Additional Follow-up for Phone Call Additional follow up Details #2::    Pt aware the message.Manon Hilding  October 22, 2009 11:17 AM  New/Updated Medications: FLONASE 50 MCG/ACT SUSP (FLUTICASONE PROPIONATE) 2 sprays each nostril once daily Prescriptions: FLONASE 50 MCG/ACT SUSP (FLUTICASONE PROPIONATE) 2 sprays each nostril once daily  #1 x 2   Entered and Authorized by:   Tereso Newcomer PA-C   Signed by:   Tereso Newcomer PA-C on 10/21/2009   Method used:   Electronically to        [facility] (retail)       [address]       [place], [place]  [id number]       Ph: [phone]       Fax: [phone]   RxID:   [id number] PROVENTIL HFA 108 (90 BASE) MCG/ACT AERS (ALBUTEROL SULFATE) 2 puffs every 4 hours as needed  #1 x 5   Entered and Authorized by:   Tereso Newcomer PA-C   Signed by:   Tereso Newcomer PA-C on 10/21/2009   Method used:   Electronically to   [facility] (retail)       [address]       [place], [place]  [id number]       Ph: [phone]       Fax: [phone]   RxID:   [id number] FUROSEMIDE 40 MG TABS (FUROSEMIDE) take one tablet by mouth once daily  #30 x 6   Entered and Authorized by:   Tereso Newcomer PA-C   Signed by:   Tereso Newcomer PA-C on 10/21/2009   Method used:   Electronically to        [facility] (retail)       [address]       [place], [place]  [address]       Ph: [phone]       Fax: [phone]   RxID:   [id number]

## 2010-07-31 NOTE — Assessment & Plan Note (Signed)
Summary: pc2   Visit Type:  Device check   History of Present Illness: Marcia [name] is seen in followoup for ICD implanted for primary prevention  in the context of ischemic cardiomyopathy.  She is enrolled in the MADIT_RIT trial  The patient denies changes in chronic SOB; she denies chest pain, edema or palpitations She has also had atrial fibrillation. This presented itself in October. She was treated with amiodarone and Coumadin. Her amiodarone dose was recently down titrated.     Current Medications (verified): 1)  Lisinopril 20 Mg Tabs (Lisinopril) .... Take 1 Am and 1/2 Pm 2)  Pacerone 200 Mg Tabs (Amiodarone Hcl) .... Take One Tablet By Once Daily 3)  Klor-Con M20 20 Meq Cr-Tabs (Potassium Chloride Crys Cr) .... Take 2 Am and 1 At Lunch 4)  Simvastatin 20 Mg Tabs (Simvastatin) .... 1/2  Tablet By Mouth Everyday 5)  Warfarin Sodium 5 Mg Tabs (Warfarin Sodium) .Marland Kitchen.. 1 Tab By Mouth Daily Except Thursday, Take 1 1/2 Tabs 6)  Metoprolol Tartrate 25 Mg Tabs (Metoprolol Tartrate) .Marland Kitchen.. 1 Tab By Mouth Two Times A Day 7)  Aspirin 325 Mg Tabs (Aspirin) .... 1/2 Tab By Mouth Daily 8)  Furosemide 40 Mg Tabs (Furosemide) .... Take 1 Am and 1 At Lunch 9)  Vitamin B-12 500 Mcg Tabs (Cyanocobalamin) 10)  Vitamin E 400 Unit Caps (Vitamin E) 11)  Zoloft 100 Mg Tabs (Sertraline Hcl) .Marland Kitchen.. 1 Tab By Mouth Daily 12)  Trazodone Hcl 50 Mg Tabs (Trazodone Hcl) .... 2 Tabs By Mouth At Bedtime 13)  Ventolin Hfa 108 (90 Base) Mcg/act Aers (Albuterol Sulfate) .... 2 Puffs Every 4 Hours As Needed For Dyspnea 14)  Vitamin D 1000 Iu .Marland Kitchen.. 1 Tab By Mouth Daily 15)  Flonase 50 Mcg/act Susp (Fluticasone Propionate) .... 2 Sprays Each Nostril Once Daily 16)  Imdur 30 Mg Xr24h-Tab (Isosorbide Mononitrate) .Marland Kitchen.. 1 Tab By Mouth Daily--03/16/10 17)  Omeprazole 40 Mg Cpdr (Omeprazole) .Marland Kitchen.. 1 Cap By Mouth Daily On Empty Stomach--1/2 Hour Before Breakfast.  Allergies: 1)  ! * Atrovent 2)  ! * Ambien 3)  ! * Flovent  Past  History:  Past Medical History: Last updated: 10/30/2009 Coronary artery disease, status post prior left anterior descending       occlusion in October 2010, status post unsuccessful intervention       secondary to dissection. Large anterior scar by nuclear study.  Acute systolic heart failure Ischemic cardiomyopathy with an EF of 30-35% by echocardiogram on   September 30, 2009, with a prior ejection fraction of 20-25% by   catheterization in October 2010.   History of paroxysmal atrial fibrillation, on Coumadin      History of posttraumatic stress disorder/obsessive-compulsive       disorder/anxiety/depression Anemia, stable Tobacco use x40 years Rheumatoid arthritis  Family History: Last updated: 10/30/2009 Her mother has a history of diabetes and father has a  history of colon cancer.   Her sister has a history of asthma.   No CAD  that she knows of  Social History: Last updated: 10/30/2009 She smoked 2 pack per day for approximately 40 years  and has now cut down to 1/2 pack a day.   No alcohol or drug use per the patient including marijuana, heroin, or methamphetamines.   Vital Signs:  Patient profile:   60 year old female Menstrual status:  postmenopausal Height:      66 inches Weight:      188 pounds BMI:  30.45 Pulse rate:   62 / minute Resp:     18 per minute BP sitting:   93 / 60  (left arm) Cuff size:   large  Vitals Entered By: Vikki Ports (April 01, 2010 4:28 PM)  Physical Exam  General:  The patient was alert and oriented in no acute distress. HEENT Normal.  Neck veins were flat, carotids were brisk.  Lungs were clear.  Heart sounds were regular without murmurs or gallops.  Abdomen was soft with active bowel sounds. There is no clubbing cyanosis or edema. Skin Warm and dry 'wound welll healed     ICD Specifications Following MD:  Sherryl Manges, MD     Referring MD:  Thurmon Fair, MD ICD Vendor:  [facility] [employer]     ICD Model Number:   [id number]     ICD Serial Number:  [id number] ICD DOI:  11/19/2009     ICD Implanting MD:  Sherryl Manges, MD Research Study Name MADIT - RIT  Lead 1:    Location: [facility]     DOI: 11/19/2009     Model #: [id number]     Serial #: [id number]     Status: active Lead 2:    Location: [facility]     DOI: 11/19/2009     Status: active  Indications::  ICM   ICD Follow Up Remote Check?  No Charge Time:  8.5 seconds     Battery Est. Longevity:  8.5 years ICD Dependent:  No       ICD Device Measurements Atrium:  Amplitude: 4.0 mV, Impedance: 769 ohms, Threshold: 0.7 V at 0.4 msec Right Ventricle:  Amplitude: 20.7 mV, Impedance: 628 ohms, Threshold: 0.4 V at 0.4 msec Shock Impedance: 52 ohms   Episodes MS Episodes:  0     Percent Mode Switch:  0     Shock:  0     ATP:  0     Nonsustained:  0     Atrial Pacing:  <1%     Ventricular Pacing:  <1%  Brady Parameters Mode DDD     Lower Rate Limit:  40     Upper Rate Limit 120 PAV 260     Sensed AV Delay:  260  Tachy Zones VF:  200     VT:  170     Next Cardiology Appt Due:  06/29/2010 Tech Comments:  Outputs reprogrammed for chronic thresholds.   Checked by [employer].  ROV as needed for research. Altha Harm, LPN  April 01, 2010 4:32 PM   Impression & Recommendations:  Problem # 1:  IMPLANTATION OF DEFIBRILLATOR, HX OF (ICD-V45.02) Device parameters and data were reviewed and no changes were made  Problem # 2:  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) stable   The following medications were removed from the medication list:    Aspirin 325 Mg Tabs (Aspirin) .Marland Kitchen... 1/2 tab by mouth daily Her updated medication list for this problem includes:    Lisinopril 20 Mg Tabs (Lisinopril) .Marland Kitchen... Take 1 am and 1/2 pm    Pacerone 200 Mg Tabs (Amiodarone hcl) .Marland Kitchen... Take one tablet by once daily    Warfarin Sodium 5 Mg Tabs (Warfarin sodium) .Marland Kitchen... 1 tab by mouth daily except thursday, take 1 1/2 tabs    Metoprolol Tartrate 25 Mg Tabs (Metoprolol tartrate) .Marland Kitchen... 1 tab by mouth two  times a day    Aspirin 325 Mg Tabs (Aspirin) .Marland Kitchen... 1/2 tab by mouth daily    Furosemide 40 Mg Tabs (  Furosemide) .Marland Kitchen... Take 1 am and 1 at lunch    Imdur 30 Mg Xr24h-tab (Isosorbide mononitrate) .Marland Kitchen... 1 tab by mouth daily--03/16/10  Problem # 3:  ATRIAL FIBRILLATION (ICD-427.31)  no recurrente on amiodaorne The following medications were removed from the medication list:    Aspirin 325 Mg Tabs (Aspirin) .Marland Kitchen... 1/2 tab by mouth daily Her updated medication list for this problem includes:    Pacerone 200 Mg Tabs (Amiodarone hcl) .Marland Kitchen... Take one tablet by once daily    Warfarin Sodium 5 Mg Tabs (Warfarin sodium) .Marland Kitchen... 1 tab by mouth daily except thursday, take 1 1/2 tabs    Metoprolol Tartrate 25 Mg Tabs (Metoprolol tartrate) .Marland Kitchen... 1 tab by mouth two times a day    Aspirin 325 Mg Tabs (Aspirin) .Marland Kitchen... 1/2 tab by mouth daily  The following medications were removed from the medication list:    Aspirin 325 Mg Tabs (Aspirin) .Marland Kitchen... 1/2 tab by mouth daily Her updated medication list for this problem includes:    Pacerone 200 Mg Tabs (Amiodarone hcl) .Marland Kitchen... Take one tablet by once daily    Warfarin Sodium 5 Mg Tabs (Warfarin sodium) .Marland Kitchen... 1 tab by mouth daily except thursday, take 1 1/2 tabs    Metoprolol Tartrate 25 Mg Tabs (Metoprolol tartrate) .Marland Kitchen... 1 tab by mouth two times a day    Aspirin 325 Mg Tabs (Aspirin) .Marland Kitchen... 1/2 tab by mouth daily

## 2010-07-31 NOTE — Medication Information (Signed)
Summary: [facility]  [facility]   Imported By: Arta Bruce 11/19/2009 14:31:30  _____________________________________________________________________  External Attachment:    Type:   Image     Comment:   External Document

## 2010-07-31 NOTE — Letter (Signed)
Summary: [facility]  [facility]   Imported By: Arta Bruce 01/10/2010 10:15:12  _____________________________________________________________________  External Attachment:    Type:   Image     Comment:   External Document

## 2010-07-31 NOTE — Assessment & Plan Note (Signed)
Summary: BAD COLD/CONGEDSTED///KT   Vital Signs:  Patient profile:   60 year old female Weight:      199.8 pounds O2 Sat:      96 % Temp:     98.2 degrees F Pulse rate:   72 / minute Pulse rhythm:   regular Resp:     22 per minute BP sitting:   87 / 63  (left arm) Cuff size:   large  Vitals Entered By: Vesta Mixer CMA (July 03, 2009 9:38 AM) CC: watery, crusty eyes, congestion, swollen glands worse in the morning, wheezing, ribs sore back ache all since Saturday morning. Is Patient Diabetic? No Pain Assessment Patient in pain? no       Does patient need assistance? Ambulation Normal   CC:  watery, crusty eyes, congestion, swollen glands worse in the morning, wheezing, and ribs sore back ache all since Saturday morning.Marland Kitchen  History of Present Illness: 1.  Depression/  anxiety:  Sleeping much better with Trazodone.  Getting 8- 10 hours.  No problems getting back to sleep if needs to get up to use bathroom.  Feeling much better in general  2.  1 week ago:  cough, congestion, body aches.  No fever.  Anterior cervical nodes very swollen and somewhat sore.  Watery, crusty eyes.  Bit of a sore throat.  Coughing up dark green sputum--seems to be lightening up.  Some dyspnea and chest congestion.   Feels cold all the time.  Feels like a "lump" today.    Allergies (verified): No Known Drug Allergies  Physical Exam  General:  NAD, congested cough Eyes:  No corneal or conjunctival inflammation noted. EOMI. Perrla. Funduscopic exam benign, without hemorrhages, exudates or papilledema. Vision grossly normal. Ears:  External ear exam shows no significant lesions or deformities.  Otoscopic examination reveals clear canals, tympanic membranes are intact bilaterally without bulging, retraction, inflammation or discharge. Hearing is grossly normal bilaterally. Nose:  Clear discharge.   Mouth:  pharynx pink and moist.   Neck:  No deformities, masses, or tenderness noted. Lungs:  Wheeze  intermittently, right lower lung field, posteriorly.  Moving air fine   Impression & Recommendations:  Problem # 1:  INSOMNIA (ICD-780.52) Major improvement  Problem # 2:  DEPRESSION/ANXIETY (ICD-300.4) Improved  Problem # 3:  CHRONIC OBSTRUCTIVE PULMONARY DISEASE, 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) Wheeze better after neb., But still apparent Bactrim DS for 10 days Orders: Nebulizer Tx (40102) Albuterol Sulfate Sol 1mg  unit dose (V2536)  Problem # 4:  VIRAL INFECTION, ACUTE (ICD-079.99) As etiology of above. Her updated medication list for this problem includes:    Aspirin 325 Mg Tabs (Aspirin) .Marland Kitchen... 1/2 tab by mouth daily  Complete Medication List: 1)  Vasotec 10 Mg Tabs (Enalapril maleate) .... Take one tablet by mouth twice daily 2)  Pacerone 200 Mg Tabs (Amiodarone hcl) .... Take one tablet by mouth twice daily 3)  Klor-con M20 20 Meq Cr-tabs (Potassium chloride crys cr) .... Take 2 tablets by mouth everyday 4)  Plavix 75 Mg Tabs (Clopidogrel bisulfate) .... Take one tablet by mouth daily 5)  Isosorbide Dinitrate 30 Mg Tabs (Isosorbide dinitrate) .... Take one tablet by mouth everyday 6)  Simvastatin 20 Mg Tabs (Simvastatin) .... 1/2  tablet by mouth everyday 7)  Warfarin Sodium 10 Mg Tabs (Warfarin sodium) .... Take one tablet by mouth everyday or as directed 8)  Metoprolol Tartrate 25 Mg Tabs (Metoprolol tartrate) .Marland Kitchen.. 1 1/2 tabs by mouth two times a day 9)  Aspirin 325  Mg Tabs (Aspirin) .... 1/2 tab by mouth daily 10)  Furosemide 40 Mg Tabs (Furosemide) .... Take one tablet by mouth twice daily 11)  Vitamin B-12 500 Mcg Tabs (Cyanocobalamin) 12)  Vitamin E 400 Unit Caps (Vitamin e) 13)  Zoloft 50 Mg Tabs (Sertraline hcl) .... 1/2 tab by mouth for 7 days in morning, then increase to 1 tab by mouth in a.m. 14)  Trazodone Hcl 50 Mg Tabs (Trazodone hcl) .Marland Kitchen.. 1 - 2 tabs by mouth at bedtime 15)  Bactrim Ds 800-160 Mg Tabs (Sulfamethoxazole-trimethoprim) .Marland Kitchen.. 1 tab by mouth two  times a day for 10 days 16)  Proventil Hfa 108 (90 Base) Mcg/act Aers (Albuterol sulfate) .... 2 puffs every 4 hours as needed  Patient Instructions: 1)  Call if breathing worsens 2)  Keep next appt Prescriptions: PROVENTIL HFA 108 (90 BASE) MCG/ACT AERS (ALBUTEROL SULFATE) 2 puffs every 4 hours as needed  #1 x 0   Entered and Authorized by:   Julieanne Manson MD   Signed by:   Julieanne Manson MD on 07/03/2009   Method used:   Faxed to ...       [facility] (retail)       [address] [place], [place]  [address]       Ph: [phone]       Fax: [phone]   RxID:   [id number] BACTRIM DS 800-160 MG TABS (SULFAMETHOXAZOLE-TRIMETHOPRIM) 1 tab by mouth two times a day for 10 days  #20 x 0   Entered and Authorized by:   Julieanne Manson MD   Signed by:   Julieanne Manson MD on 07/03/2009   Method used:   Faxed to ...       [facility] (retail)       [address] [place], [place]  [address]       Ph: [phone]       Fax: [phone]   RxID:   [id number]    Medication Administration  Medication # 1:    Medication: Albuterol Sulfate Sol 1mg  unit dose    Diagnosis: CHRONIC OBSTRUCTIVE PULMONARY DISEASE, 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)    Route: inhaled    Patient tolerated medication without complications    Given by: Vesta Mixer CMA (July 10, 2009 3:50 PM)  Orders Added: 1)  Est. Patient Level III [23762] 2)  Nebulizer Tx [83151] 3)  Est. Patient Level III [76160] 4)  Albuterol Sulfate Sol 1mg  unit dose [V3710]

## 2010-08-01 NOTE — Letter (Signed)
Summary: [facility]  [facility]   Imported By: Arta Bruce 05/28/2010 16:21:00  _____________________________________________________________________  External Attachment:    Type:   Image     Comment:   External Document

## 2010-08-06 NOTE — Letter (Signed)
Summary: [facility]  [facility]   Imported By: Marylou Mccoy 07/30/2010 11:46:13  _____________________________________________________________________  External Attachment:    Type:   Image     Comment:   External Document

## 2010-09-04 ENCOUNTER — Encounter (INDEPENDENT_AMBULATORY_CARE_PROVIDER_SITE_OTHER): Payer: Self-pay | Admitting: *Deleted

## 2010-09-09 NOTE — Letter (Signed)
[facility]   Imported By: Arta Bruce 09/01/2010 14:41:11  _____________________________________________________________________  External Attachment:    Type:   Image     Comment:   External Document

## 2010-09-09 NOTE — Letter (Signed)
Summary: Device-Delinquent Check  [facility], Main Office  [address]   [place], [place] [address]   Phone: [phone]  Fax: [phone]     September 04, 2010 MRN: [id number]   [facility] [address] [place], [place]  [address]   Dear Ms. Smits,  According to our records, you have not had your implanted device checked in the recommended period of time.  We are unable to determine appropriate device function without checking your device on a regular basis.  Please call our office to schedule an appointment as soon as possible.  If you are having your device checked by another physician, please call us so that we may update our records.  Thank you,  Letta Moynahan, EMT  September 04, 2010 12:49 PM  [facility]

## 2010-09-11 LAB — POCT I-STAT, CHEM 8
Chloride: 105 mEq/L (ref 96–112)
Hemoglobin: 13.6 g/dL (ref 12.0–15.0)
Potassium: 4 mEq/L (ref 3.5–5.1)
Sodium: 141 mEq/L (ref 135–145)

## 2010-09-11 LAB — URINALYSIS, ROUTINE W REFLEX MICROSCOPIC
Bilirubin Urine: NEGATIVE
Glucose, UA: NEGATIVE mg/dL
Ketones, ur: NEGATIVE mg/dL
Protein, ur: NEGATIVE mg/dL
pH: 7 (ref 5.0–8.0)

## 2010-09-11 LAB — BASIC METABOLIC PANEL
BUN: 18 mg/dL (ref 6–23)
Calcium: 8.8 mg/dL (ref 8.4–10.5)
Creatinine, Ser: 0.81 mg/dL (ref 0.4–1.2)
GFR calc non Af Amer: >60 mL/min (ref >60)
Glucose, Bld: 95 mg/dL (ref 70–99)
Potassium: 3.4 mEq/L — ABNORMAL LOW (ref 3.5–5.1)

## 2010-09-11 LAB — GLUCOSE, CAPILLARY

## 2010-09-11 LAB — CBC
HCT: 34.7 % — ABNORMAL LOW (ref 36.0–46.0)
Hemoglobin: 11.7 g/dL — ABNORMAL LOW (ref 12.0–15.0)
MCH: 31.5 pg (ref 26.0–34.0)
MCHC: 33.7 g/dL (ref 30.0–36.0)
MCHC: 35.5 g/dL (ref 30.0–36.0)
MCV: 93.3 fL (ref 78.0–100.0)
RDW: 15.7 % — ABNORMAL HIGH (ref 11.5–15.5)
RDW: 16.1 % — ABNORMAL HIGH (ref 11.5–15.5)

## 2010-09-11 LAB — COMPREHENSIVE METABOLIC PANEL
ALT: 36 U/L — ABNORMAL HIGH (ref 0–35)
Alkaline Phosphatase: 70 U/L (ref 39–117)
Chloride: 102 mEq/L (ref 96–112)
GFR calc Af Amer: >60 mL/min (ref >60)
GFR calc non Af Amer: >60 mL/min (ref >60)
Glucose, Bld: 139 mg/dL — ABNORMAL HIGH (ref 70–99)
Potassium: 3.5 mEq/L (ref 3.5–5.1)
Sodium: 138 mEq/L (ref 135–145)
Total Bilirubin: 1.3 mg/dL — ABNORMAL HIGH (ref 0.3–1.2)

## 2010-09-11 LAB — PROTIME-INR
INR: 2.24 — ABNORMAL HIGH (ref 0.00–1.49)
INR: 2.38 — ABNORMAL HIGH (ref 0.00–1.49)

## 2010-09-11 LAB — CARDIAC PANEL(CRET KIN+CKTOT+MB+TROPI)
CK, MB: 1.8 ng/mL (ref 0.3–4.0)
Relative Index: INVALID (ref 0.0–2.5)
Total CK: 94 U/L (ref 7–177)
Troponin I: 0.02 ng/mL (ref 0.00–0.06)
Troponin I: 0.03 ng/mL (ref 0.00–0.06)

## 2010-09-11 LAB — URINE CULTURE: Colony Count: 55000

## 2010-09-11 LAB — DIFFERENTIAL
Basophils Relative: 0 % (ref 0–1)
Eosinophils Absolute: 0 10*3/uL (ref 0.0–0.7)
Lymphs Abs: 1 10*3/uL (ref 0.7–4.0)
Monocytes Absolute: 0.6 10*3/uL (ref 0.1–1.0)
Monocytes Relative: 4 % (ref 3–12)
Neutrophils Relative %: 88 % — ABNORMAL HIGH (ref 43–77)

## 2010-09-11 LAB — HEMOGLOBIN A1C: Mean Plasma Glucose: 120 mg/dL — ABNORMAL HIGH (ref <117)

## 2010-09-11 LAB — CK TOTAL AND CKMB (NOT AT ARMC)
Relative Index: 2.2 (ref 0.0–2.5)
Total CK: 115 U/L (ref 7–177)

## 2010-09-11 LAB — TSH: TSH: 0.562 u[IU]/mL (ref 0.350–4.500)

## 2010-09-11 LAB — POCT CARDIAC MARKERS: Troponin i, poc: <0.05 ng/mL (ref 0.00–0.09)

## 2010-09-11 LAB — MAGNESIUM: Magnesium: 1.9 mg/dL (ref 1.5–2.5)

## 2010-09-15 LAB — SURGICAL PCR SCREEN: Staphylococcus aureus: NEGATIVE

## 2010-09-15 LAB — PROTIME-INR
INR: 0.96 (ref 0.00–1.49)
INR: 1.04 (ref 0.00–1.49)

## 2010-09-17 LAB — POCT I-STAT, CHEM 8
Chloride: 108 mEq/L (ref 96–112)
Chloride: 109 mEq/L (ref 96–112)
Creatinine, Ser: 0.8 mg/dL (ref 0.4–1.2)
Glucose, Bld: 96 mg/dL (ref 70–99)
HCT: 36 % (ref 36.0–46.0)
HCT: 42 % (ref 36.0–46.0)
Hemoglobin: 14.3 g/dL (ref 12.0–15.0)
Potassium: 3.7 mEq/L (ref 3.5–5.1)
Potassium: 3.8 mEq/L (ref 3.5–5.1)
Sodium: 136 mEq/L (ref 135–145)
Sodium: 137 mEq/L (ref 135–145)

## 2010-09-17 LAB — PROTIME-INR
INR: 1.5 — ABNORMAL HIGH (ref 0.00–1.49)
INR: 2.39 — ABNORMAL HIGH (ref 0.00–1.49)
INR: 2.5 — ABNORMAL HIGH (ref 0.00–1.49)
Prothrombin Time: 24.4 seconds — ABNORMAL HIGH (ref 11.6–15.2)
Prothrombin Time: 25.9 seconds — ABNORMAL HIGH (ref 11.6–15.2)
Prothrombin Time: 26.8 seconds — ABNORMAL HIGH (ref 11.6–15.2)

## 2010-09-17 LAB — GLUCOSE, CAPILLARY
Glucose-Capillary: 100 mg/dL — ABNORMAL HIGH (ref 70–99)
Glucose-Capillary: 101 mg/dL — ABNORMAL HIGH (ref 70–99)
Glucose-Capillary: 104 mg/dL — ABNORMAL HIGH (ref 70–99)
Glucose-Capillary: 108 mg/dL — ABNORMAL HIGH (ref 70–99)
Glucose-Capillary: 110 mg/dL — ABNORMAL HIGH (ref 70–99)
Glucose-Capillary: 111 mg/dL — ABNORMAL HIGH (ref 70–99)
Glucose-Capillary: 117 mg/dL — ABNORMAL HIGH (ref 70–99)
Glucose-Capillary: 95 mg/dL (ref 70–99)
Glucose-Capillary: 98 mg/dL (ref 70–99)

## 2010-09-17 LAB — CARDIAC PANEL(CRET KIN+CKTOT+MB+TROPI)
CK, MB: 3 ng/mL (ref 0.3–4.0)
Total CK: 85 U/L (ref 7–177)
Total CK: 96 U/L (ref 7–177)
Troponin I: 0.05 ng/mL (ref 0.00–0.06)

## 2010-09-17 LAB — URINE CULTURE

## 2010-09-17 LAB — BASIC METABOLIC PANEL
BUN: 12 mg/dL (ref 6–23)
CO2: 28 mEq/L (ref 19–32)
Calcium: 8.5 mg/dL (ref 8.4–10.5)
Calcium: 8.6 mg/dL (ref 8.4–10.5)
Chloride: 102 mEq/L (ref 96–112)
Chloride: 105 mEq/L (ref 96–112)
Creatinine, Ser: 0.83 mg/dL (ref 0.4–1.2)
Creatinine, Ser: 0.83 mg/dL (ref 0.4–1.2)
Creatinine, Ser: 0.86 mg/dL (ref 0.4–1.2)
GFR calc Af Amer: >60 mL/min (ref >60)
GFR calc Af Amer: >60 mL/min (ref >60)
GFR calc non Af Amer: >60 mL/min (ref >60)
Glucose, Bld: 94 mg/dL (ref 70–99)

## 2010-09-17 LAB — DIFFERENTIAL
Basophils Absolute: 0.1 10*3/uL (ref 0.0–0.1)
Lymphocytes Relative: 24 % (ref 12–46)
Lymphs Abs: 2.8 10*3/uL (ref 0.7–4.0)
Monocytes Absolute: 0.3 10*3/uL (ref 0.1–1.0)
Neutro Abs: 8.4 10*3/uL — ABNORMAL HIGH (ref 1.7–7.7)

## 2010-09-17 LAB — COMPREHENSIVE METABOLIC PANEL
BUN: 13 mg/dL (ref 6–23)
Calcium: 8.5 mg/dL (ref 8.4–10.5)
Glucose, Bld: 85 mg/dL (ref 70–99)
Sodium: 136 mEq/L (ref 135–145)
Total Protein: 5.8 g/dL — ABNORMAL LOW (ref 6.0–8.3)

## 2010-09-17 LAB — CBC
HCT: 34 % — ABNORMAL LOW (ref 36.0–46.0)
Hemoglobin: 11.6 g/dL — ABNORMAL LOW (ref 12.0–15.0)
Hemoglobin: 13.2 g/dL (ref 12.0–15.0)
MCHC: 33.3 g/dL (ref 30.0–36.0)
MCHC: 33.9 g/dL (ref 30.0–36.0)
MCV: 90.6 fL (ref 78.0–100.0)
Platelets: 215 10*3/uL (ref 150–400)
Platelets: 296 10*3/uL (ref 150–400)
RBC: 3.53 MIL/uL — ABNORMAL LOW (ref 3.87–5.11)
RBC: 3.59 MIL/uL — ABNORMAL LOW (ref 3.87–5.11)
RDW: 17.5 % — ABNORMAL HIGH (ref 11.5–15.5)
RDW: 17.7 % — ABNORMAL HIGH (ref 11.5–15.5)
WBC: 11.6 10*3/uL — ABNORMAL HIGH (ref 4.0–10.5)
WBC: 6 10*3/uL (ref 4.0–10.5)
WBC: 6.4 10*3/uL (ref 4.0–10.5)

## 2010-09-17 LAB — BRAIN NATRIURETIC PEPTIDE
Pro B Natriuretic peptide (BNP): 482 pg/mL — ABNORMAL HIGH (ref 0.0–100.0)
Pro B Natriuretic peptide (BNP): 490 pg/mL — ABNORMAL HIGH (ref 0.0–100.0)

## 2010-09-17 LAB — POCT CARDIAC MARKERS
CKMB, poc: 1.1 ng/mL (ref 1.0–8.0)
CKMB, poc: 1.4 ng/mL (ref 1.0–8.0)
Myoglobin, poc: 48.9 ng/mL (ref 12–200)
Myoglobin, poc: 77.6 ng/mL (ref 12–200)
Troponin i, poc: <0.05 ng/mL (ref 0.00–0.09)
Troponin i, poc: <0.05 ng/mL (ref 0.00–0.09)

## 2010-09-17 LAB — URINALYSIS, ROUTINE W REFLEX MICROSCOPIC
Bilirubin Urine: NEGATIVE
Bilirubin Urine: NEGATIVE
Hgb urine dipstick: NEGATIVE
Hgb urine dipstick: NEGATIVE
Ketones, ur: NEGATIVE mg/dL
Nitrite: NEGATIVE
Protein, ur: NEGATIVE mg/dL
Protein, ur: NEGATIVE mg/dL
Specific Gravity, Urine: 1.023 (ref 1.005–1.030)
Urobilinogen, UA: 0.2 mg/dL (ref 0.0–1.0)
Urobilinogen, UA: 1 mg/dL (ref 0.0–1.0)

## 2010-09-17 LAB — HEMOGLOBIN A1C
Hgb A1c MFr Bld: 6.2 % — ABNORMAL HIGH (ref 4.6–6.1)
Mean Plasma Glucose: 131 mg/dL

## 2010-09-17 LAB — LIPID PANEL
LDL Cholesterol: 118 mg/dL — ABNORMAL HIGH (ref 0–99)
Triglycerides: 127 mg/dL (ref <150)

## 2010-09-17 LAB — CK TOTAL AND CKMB (NOT AT ARMC): CK, MB: 2.6 ng/mL (ref 0.3–4.0)

## 2010-09-17 LAB — TSH: TSH: 0.948 u[IU]/mL (ref 0.350–4.500)

## 2010-09-17 LAB — URINE MICROSCOPIC-ADD ON

## 2010-09-28 HISTORY — PX: APPENDECTOMY: SHX54

## 2010-09-29 ENCOUNTER — Encounter (INDEPENDENT_AMBULATORY_CARE_PROVIDER_SITE_OTHER): Payer: Medicaid Other | Admitting: *Deleted

## 2010-09-29 ENCOUNTER — Encounter (INDEPENDENT_AMBULATORY_CARE_PROVIDER_SITE_OTHER): Payer: Self-pay

## 2010-09-29 DIAGNOSIS — I428 Other cardiomyopathies: Secondary | ICD-10-CM

## 2010-09-29 DIAGNOSIS — R0989 Other specified symptoms and signs involving the circulatory and respiratory systems: Secondary | ICD-10-CM

## 2010-09-30 ENCOUNTER — Emergency Department (HOSPITAL_COMMUNITY): Payer: Medicaid Other

## 2010-09-30 ENCOUNTER — Other Ambulatory Visit: Payer: Self-pay | Admitting: General Surgery

## 2010-09-30 ENCOUNTER — Other Ambulatory Visit: Payer: Self-pay

## 2010-09-30 ENCOUNTER — Inpatient Hospital Stay (HOSPITAL_COMMUNITY)
Admission: EM | Admit: 2010-09-30 | Discharge: 2010-10-04 | DRG: 339 | Disposition: A | Discharge status: Home / Self Care | Payer: Medicaid Other | Attending: General Surgery | Admitting: General Surgery

## 2010-09-30 DIAGNOSIS — K352 Acute appendicitis with generalized peritonitis, without abscess: Principal | ICD-10-CM | POA: Diagnosis present

## 2010-09-30 DIAGNOSIS — C181 Malignant neoplasm of appendix: Secondary | ICD-10-CM | POA: Diagnosis present

## 2010-09-30 DIAGNOSIS — J449 Chronic obstructive pulmonary disease, unspecified: Secondary | ICD-10-CM | POA: Diagnosis present

## 2010-09-30 DIAGNOSIS — I2589 Other forms of chronic ischemic heart disease: Secondary | ICD-10-CM | POA: Diagnosis present

## 2010-09-30 DIAGNOSIS — I252 Old myocardial infarction: Secondary | ICD-10-CM

## 2010-09-30 DIAGNOSIS — D62 Acute posthemorrhagic anemia: Secondary | ICD-10-CM | POA: Diagnosis not present

## 2010-09-30 DIAGNOSIS — I509 Heart failure, unspecified: Secondary | ICD-10-CM | POA: Diagnosis present

## 2010-09-30 DIAGNOSIS — Z7901 Long term (current) use of anticoagulants: Secondary | ICD-10-CM

## 2010-09-30 DIAGNOSIS — M069 Rheumatoid arthritis, unspecified: Secondary | ICD-10-CM | POA: Diagnosis present

## 2010-09-30 DIAGNOSIS — K560 Paralytic ileus: Secondary | ICD-10-CM | POA: Diagnosis not present

## 2010-09-30 DIAGNOSIS — I4891 Unspecified atrial fibrillation: Secondary | ICD-10-CM | POA: Diagnosis present

## 2010-09-30 DIAGNOSIS — I5042 Chronic combined systolic (congestive) and diastolic (congestive) heart failure: Secondary | ICD-10-CM | POA: Diagnosis present

## 2010-09-30 DIAGNOSIS — F3289 Other specified depressive episodes: Secondary | ICD-10-CM | POA: Diagnosis present

## 2010-09-30 DIAGNOSIS — Z7982 Long term (current) use of aspirin: Secondary | ICD-10-CM

## 2010-09-30 DIAGNOSIS — I2510 Atherosclerotic heart disease of native coronary artery without angina pectoris: Secondary | ICD-10-CM | POA: Diagnosis present

## 2010-09-30 DIAGNOSIS — F1210 Cannabis abuse, uncomplicated: Secondary | ICD-10-CM | POA: Diagnosis present

## 2010-09-30 DIAGNOSIS — F411 Generalized anxiety disorder: Secondary | ICD-10-CM | POA: Diagnosis present

## 2010-09-30 DIAGNOSIS — K35209 Acute appendicitis with generalized peritonitis, without abscess, unspecified as to perforation: Principal | ICD-10-CM | POA: Diagnosis present

## 2010-09-30 DIAGNOSIS — F17200 Nicotine dependence, unspecified, uncomplicated: Secondary | ICD-10-CM | POA: Diagnosis present

## 2010-09-30 DIAGNOSIS — Z95810 Presence of automatic (implantable) cardiac defibrillator: Secondary | ICD-10-CM

## 2010-09-30 DIAGNOSIS — Y836 Removal of other organ (partial) (total) as the cause of abnormal reaction of the patient, or of later complication, without mention of misadventure at the time of the procedure: Secondary | ICD-10-CM | POA: Diagnosis not present

## 2010-09-30 DIAGNOSIS — J4489 Other specified chronic obstructive pulmonary disease: Secondary | ICD-10-CM | POA: Diagnosis present

## 2010-09-30 DIAGNOSIS — F329 Major depressive disorder, single episode, unspecified: Secondary | ICD-10-CM | POA: Diagnosis present

## 2010-09-30 DIAGNOSIS — F4310 Post-traumatic stress disorder, unspecified: Secondary | ICD-10-CM | POA: Diagnosis present

## 2010-09-30 DIAGNOSIS — K929 Disease of digestive system, unspecified: Secondary | ICD-10-CM | POA: Diagnosis not present

## 2010-09-30 LAB — GLUCOSE, CAPILLARY: Glucose-Capillary: 139 mg/dL — ABNORMAL HIGH (ref 70–99)

## 2010-09-30 LAB — BASIC METABOLIC PANEL
Calcium: 9.3 mg/dL (ref 8.4–10.5)
GFR calc Af Amer: >60 mL/min (ref >60)
GFR calc non Af Amer: >60 mL/min (ref >60)
Sodium: 135 mEq/L (ref 135–145)

## 2010-09-30 LAB — CBC
HCT: 37.8 % (ref 36.0–46.0)
Hemoglobin: 12.9 g/dL (ref 12.0–15.0)
MCH: 31.5 pg (ref 26.0–34.0)
MCHC: 34.1 g/dL (ref 30.0–36.0)
MCV: 92.2 fL (ref 78.0–100.0)
RDW: 14.3 % (ref 11.5–15.5)

## 2010-09-30 LAB — DIFFERENTIAL
Basophils Absolute: 0 10*3/uL (ref 0.0–0.1)
Eosinophils Relative: 0 % (ref 0–5)
Lymphocytes Relative: 10 % — ABNORMAL LOW (ref 12–46)
Monocytes Absolute: 0.6 10*3/uL (ref 0.1–1.0)
Monocytes Relative: 4 % (ref 3–12)
Neutro Abs: 12.2 10*3/uL — ABNORMAL HIGH (ref 1.7–7.7)

## 2010-09-30 LAB — URINALYSIS, ROUTINE W REFLEX MICROSCOPIC
Hgb urine dipstick: NEGATIVE
Ketones, ur: NEGATIVE mg/dL
Protein, ur: NEGATIVE mg/dL
Urobilinogen, UA: 0.2 mg/dL (ref 0.0–1.0)

## 2010-09-30 LAB — MRSA PCR SCREENING: MRSA by PCR: NEGATIVE

## 2010-09-30 LAB — ABO/RH: ABO/RH(D): A POS

## 2010-09-30 LAB — HEPATIC FUNCTION PANEL
Bilirubin, Direct: <0.1 mg/dL (ref 0.0–0.3)
Total Bilirubin: 0.7 mg/dL (ref 0.3–1.2)

## 2010-09-30 MED ORDER — IOHEXOL 350 MG/ML SOLN
100.0000 mL | Freq: Once | INTRAVENOUS | Status: AC | PRN
  Administered 2010-09-30: 100 mL via INTRAVENOUS

## 2010-10-01 LAB — BASIC METABOLIC PANEL
BUN: 9 mg/dL (ref 6–23)
Calcium: 8.3 mg/dL — ABNORMAL LOW (ref 8.4–10.5)
GFR calc non Af Amer: >60 mL/min (ref >60)
Glucose, Bld: 121 mg/dL — ABNORMAL HIGH (ref 70–99)
Sodium: 137 mEq/L (ref 135–145)

## 2010-10-01 LAB — CBC
MCH: 31.8 pg (ref 26.0–34.0)
MCV: 96 fL (ref 78.0–100.0)
Platelets: 175 10*3/uL (ref 150–400)
RDW: 14.9 % (ref 11.5–15.5)

## 2010-10-01 LAB — PREPARE FRESH FROZEN PLASMA

## 2010-10-01 LAB — GLUCOSE, CAPILLARY: Glucose-Capillary: 133 mg/dL — ABNORMAL HIGH (ref 70–99)

## 2010-10-02 LAB — BASIC METABOLIC PANEL
BUN: 10 mg/dL (ref 6–23)
BUN: 10 mg/dL (ref 6–23)
BUN: 10 mg/dL (ref 6–23)
BUN: 15 mg/dL (ref 6–23)
BUN: 17 mg/dL (ref 6–23)
BUN: 19 mg/dL (ref 6–23)
BUN: 22 mg/dL (ref 6–23)
BUN: 31 mg/dL — ABNORMAL HIGH (ref 6–23)
CO2: 26 mEq/L (ref 19–32)
CO2: 30 mEq/L (ref 19–32)
Calcium: 8.7 mg/dL (ref 8.4–10.5)
Calcium: 9 mg/dL (ref 8.4–10.5)
Calcium: 9 mg/dL (ref 8.4–10.5)
Calcium: 9 mg/dL (ref 8.4–10.5)
Calcium: 9.1 mg/dL (ref 8.4–10.5)
Chloride: 103 mEq/L (ref 96–112)
Chloride: 98 mEq/L (ref 96–112)
Chloride: 99 mEq/L (ref 96–112)
Creatinine, Ser: 0.72 mg/dL (ref 0.4–1.2)
Creatinine, Ser: 0.76 mg/dL (ref 0.4–1.2)
Creatinine, Ser: 0.78 mg/dL (ref 0.4–1.2)
Creatinine, Ser: 0.87 mg/dL (ref 0.4–1.2)
Creatinine, Ser: 0.88 mg/dL (ref 0.4–1.2)
Creatinine, Ser: 1.03 mg/dL (ref 0.4–1.2)
GFR calc Af Amer: >60 mL/min (ref >60)
GFR calc Af Amer: >60 mL/min (ref >60)
GFR calc Af Amer: >60 mL/min (ref >60)
GFR calc Af Amer: >60 mL/min (ref >60)
GFR calc non Af Amer: >60 mL/min (ref >60)
GFR calc non Af Amer: >60 mL/min (ref >60)
GFR calc non Af Amer: >60 mL/min (ref >60)
GFR calc non Af Amer: >60 mL/min (ref >60)
GFR calc non Af Amer: >60 mL/min (ref >60)
GFR calc non Af Amer: >60 mL/min (ref >60)
Glucose, Bld: 114 mg/dL — ABNORMAL HIGH (ref 70–99)
Glucose, Bld: 164 mg/dL — ABNORMAL HIGH (ref 70–99)
Glucose, Bld: 206 mg/dL — ABNORMAL HIGH (ref 70–99)
Potassium: 3.7 mEq/L (ref 3.5–5.1)
Potassium: 3.8 mEq/L (ref 3.5–5.1)
Potassium: 4.1 mEq/L (ref 3.5–5.1)
Potassium: 4.9 mEq/L (ref 3.5–5.1)
Sodium: 135 mEq/L (ref 135–145)
Sodium: 136 mEq/L (ref 135–145)
Sodium: 140 mEq/L (ref 135–145)

## 2010-10-02 LAB — GLUCOSE, CAPILLARY
Glucose-Capillary: 124 mg/dL — ABNORMAL HIGH (ref 70–99)
Glucose-Capillary: 127 mg/dL — ABNORMAL HIGH (ref 70–99)
Glucose-Capillary: 132 mg/dL — ABNORMAL HIGH (ref 70–99)
Glucose-Capillary: 138 mg/dL — ABNORMAL HIGH (ref 70–99)
Glucose-Capillary: 145 mg/dL — ABNORMAL HIGH (ref 70–99)
Glucose-Capillary: 148 mg/dL — ABNORMAL HIGH (ref 70–99)
Glucose-Capillary: 148 mg/dL — ABNORMAL HIGH (ref 70–99)
Glucose-Capillary: 157 mg/dL — ABNORMAL HIGH (ref 70–99)
Glucose-Capillary: 168 mg/dL — ABNORMAL HIGH (ref 70–99)
Glucose-Capillary: 172 mg/dL — ABNORMAL HIGH (ref 70–99)
Glucose-Capillary: 188 mg/dL — ABNORMAL HIGH (ref 70–99)
Glucose-Capillary: 194 mg/dL — ABNORMAL HIGH (ref 70–99)
Glucose-Capillary: 109 mg/dL — ABNORMAL HIGH (ref 70–99)

## 2010-10-02 LAB — CBC
HCT: 33.1 % — ABNORMAL LOW (ref 36.0–46.0)
HCT: 33.1 % — ABNORMAL LOW (ref 36.0–46.0)
HCT: 33.8 % — ABNORMAL LOW (ref 36.0–46.0)
HCT: 36.8 % (ref 36.0–46.0)
HCT: 37.5 % (ref 36.0–46.0)
Hemoglobin: 11.1 g/dL — ABNORMAL LOW (ref 12.0–15.0)
Hemoglobin: 11.1 g/dL — ABNORMAL LOW (ref 12.0–15.0)
Hemoglobin: 11.3 g/dL — ABNORMAL LOW (ref 12.0–15.0)
Hemoglobin: 12.2 g/dL (ref 12.0–15.0)
Hemoglobin: 12.6 g/dL (ref 12.0–15.0)
MCHC: 33.1 g/dL (ref 30.0–36.0)
MCHC: 33.3 g/dL (ref 30.0–36.0)
MCHC: 33.5 g/dL (ref 30.0–36.0)
MCHC: 33.5 g/dL (ref 30.0–36.0)
MCV: 88.7 fL (ref 78.0–100.0)
MCV: 89.2 fL (ref 78.0–100.0)
MCV: 89.6 fL (ref 78.0–100.0)
MCV: 89.8 fL (ref 78.0–100.0)
MCV: 90 fL (ref 78.0–100.0)
Platelets: 322 10*3/uL (ref 150–400)
Platelets: 346 10*3/uL (ref 150–400)
Platelets: 350 10*3/uL (ref 150–400)
Platelets: 369 10*3/uL (ref 150–400)
RBC: 3.61 MIL/uL — ABNORMAL LOW (ref 3.87–5.11)
RDW: 15.7 % — ABNORMAL HIGH (ref 11.5–15.5)
RDW: 16 % — ABNORMAL HIGH (ref 11.5–15.5)
RDW: 16.1 % — ABNORMAL HIGH (ref 11.5–15.5)
WBC: 11.4 10*3/uL — ABNORMAL HIGH (ref 4.0–10.5)
WBC: 12.6 10*3/uL — ABNORMAL HIGH (ref 4.0–10.5)
WBC: 12.9 10*3/uL — ABNORMAL HIGH (ref 4.0–10.5)
WBC: 16.3 10*3/uL — ABNORMAL HIGH (ref 4.0–10.5)

## 2010-10-02 LAB — POCT I-STAT 3, ART BLOOD GAS (G3+)
Acid-Base Excess: 1 mmol/L (ref 0.0–2.0)
Bicarbonate: 25 mEq/L — ABNORMAL HIGH (ref 20.0–24.0)
Bicarbonate: 26.2 mEq/L — ABNORMAL HIGH (ref 20.0–24.0)
pCO2 arterial: 42.5 mmHg (ref 35.0–45.0)
pH, Arterial: 7.445 — ABNORMAL HIGH (ref 7.350–7.400)
pO2, Arterial: 32 mmHg — CL (ref 80.0–100.0)
pO2, Arterial: 61 mmHg — ABNORMAL LOW (ref 80.0–100.0)

## 2010-10-02 LAB — PROTIME-INR: Prothrombin Time: 14 seconds (ref 11.6–15.2)

## 2010-10-02 LAB — DIFFERENTIAL
Eosinophils Absolute: 0 10*3/uL (ref 0.0–0.7)
Eosinophils Relative: 0 % (ref 0–5)
Eosinophils Relative: 0 % (ref 0–5)
Lymphocytes Relative: 11 % — ABNORMAL LOW (ref 12–46)
Lymphocytes Relative: 15 % (ref 12–46)
Lymphs Abs: 1.4 10*3/uL (ref 0.7–4.0)
Lymphs Abs: 1.7 10*3/uL (ref 0.7–4.0)
Monocytes Absolute: 0.9 10*3/uL (ref 0.1–1.0)
Monocytes Relative: 7 % (ref 3–12)

## 2010-10-02 LAB — POCT I-STAT 3, VENOUS BLOOD GAS (G3P V)
Acid-Base Excess: 2 mmol/L (ref 0.0–2.0)
O2 Saturation: 66 %
TCO2: 28 mmol/L (ref 0–100)
pO2, Ven: 34 mmHg (ref 30.0–45.0)

## 2010-10-02 LAB — TROPONIN I: Troponin I: 0.04 ng/mL (ref 0.00–0.06)

## 2010-10-02 LAB — CARDIAC PANEL(CRET KIN+CKTOT+MB+TROPI)
CK, MB: 0.8 ng/mL (ref 0.3–4.0)
CK, MB: 1 ng/mL (ref 0.3–4.0)
Relative Index: INVALID (ref 0.0–2.5)
Total CK: 23 U/L (ref 7–177)
Troponin I: 0.04 ng/mL (ref 0.00–0.06)
Troponin I: 0.11 ng/mL — ABNORMAL HIGH (ref 0.00–0.06)

## 2010-10-02 LAB — BRAIN NATRIURETIC PEPTIDE
Pro B Natriuretic peptide (BNP): 415 pg/mL — ABNORMAL HIGH (ref 0.0–100.0)
Pro B Natriuretic peptide (BNP): 448 pg/mL — ABNORMAL HIGH (ref 0.0–100.0)
Pro B Natriuretic peptide (BNP): 791 pg/mL — ABNORMAL HIGH (ref 0.0–100.0)
Pro B Natriuretic peptide (BNP): 833 pg/mL — ABNORMAL HIGH (ref 0.0–100.0)

## 2010-10-02 LAB — CK TOTAL AND CKMB (NOT AT ARMC): Relative Index: INVALID (ref 0.0–2.5)

## 2010-10-02 LAB — POCT CARDIAC MARKERS: Troponin i, poc: <0.05 ng/mL (ref 0.00–0.09)

## 2010-10-02 LAB — RAPID URINE DRUG SCREEN, HOSP PERFORMED
Amphetamines: NOT DETECTED
Benzodiazepines: POSITIVE — AB
Cocaine: NOT DETECTED
Tetrahydrocannabinol: NOT DETECTED

## 2010-10-02 LAB — POTASSIUM: Potassium: 4.2 mEq/L (ref 3.5–5.1)

## 2010-10-02 LAB — T4, FREE: Free T4: 1.19 ng/dL (ref 0.80–1.80)

## 2010-10-02 LAB — LIPID PANEL
HDL: 35 mg/dL — ABNORMAL LOW (ref >39)
Total CHOL/HDL Ratio: 3.6 RATIO
Triglycerides: 85 mg/dL (ref <150)
VLDL: 17 mg/dL (ref 0–40)

## 2010-10-02 LAB — MAGNESIUM: Magnesium: 1.9 mg/dL (ref 1.5–2.5)

## 2010-10-03 LAB — POCT I-STAT, CHEM 8
BUN: 6 mg/dL (ref 6–23)
Chloride: 105 mEq/L (ref 96–112)
Potassium: 4 mEq/L (ref 3.5–5.1)
Sodium: 140 mEq/L (ref 135–145)

## 2010-10-03 LAB — CBC
Hemoglobin: 13.2 g/dL (ref 12.0–15.0)
MCH: 32.1 pg (ref 26.0–34.0)
Platelets: 202 10*3/uL (ref 150–400)
Platelets: 380 10*3/uL (ref 150–400)
RBC: 2.93 MIL/uL — ABNORMAL LOW (ref 3.87–5.11)
RDW: 14.6 % (ref 11.5–15.5)

## 2010-10-03 LAB — GLUCOSE, CAPILLARY
Glucose-Capillary: 99 mg/dL (ref 70–99)
Glucose-Capillary: 83 mg/dL (ref 70–99)

## 2010-10-03 LAB — BASIC METABOLIC PANEL
Calcium: 8.7 mg/dL (ref 8.4–10.5)
Chloride: 101 mEq/L (ref 96–112)
Creatinine, Ser: 0.58 mg/dL (ref 0.4–1.2)
GFR calc Af Amer: >60 mL/min (ref >60)

## 2010-10-03 LAB — DIFFERENTIAL
Basophils Absolute: 0 10*3/uL (ref 0.0–0.1)
Lymphocytes Relative: 24 % (ref 12–46)
Neutro Abs: 5.9 10*3/uL (ref 1.7–7.7)
Neutrophils Relative %: 70 % (ref 43–77)

## 2010-10-03 LAB — PROTIME-INR: Prothrombin Time: 16.7 seconds — ABNORMAL HIGH (ref 11.6–15.2)

## 2010-10-04 LAB — CBC
HCT: 38.4 % (ref 36.0–46.0)
MCHC: 34.4 g/dL (ref 30.0–36.0)
MCV: 91.9 fL (ref 78.0–100.0)
RBC: 4.18 MIL/uL (ref 3.87–5.11)

## 2010-10-04 LAB — DIFFERENTIAL
Basophils Relative: 1 % (ref 0–1)
Eosinophils Absolute: 0 10*3/uL (ref 0.0–0.7)
Eosinophils Relative: 0 % (ref 0–5)
Monocytes Relative: 3 % (ref 3–12)
Neutrophils Relative %: 79 % — ABNORMAL HIGH (ref 43–77)

## 2010-10-04 LAB — HEPATIC FUNCTION PANEL
ALT: 27 U/L (ref 0–35)
Alkaline Phosphatase: 104 U/L (ref 39–117)
Bilirubin, Direct: 0.3 mg/dL (ref 0.0–0.3)
Indirect Bilirubin: 0.8 mg/dL (ref 0.3–0.9)
Total Bilirubin: 1.1 mg/dL (ref 0.3–1.2)
Total Protein: 7.2 g/dL (ref 6.0–8.3)

## 2010-10-04 LAB — GLUCOSE, CAPILLARY: Glucose-Capillary: 98 mg/dL (ref 70–99)

## 2010-10-04 LAB — BRAIN NATRIURETIC PEPTIDE: Pro B Natriuretic peptide (BNP): 484 pg/mL — ABNORMAL HIGH (ref 0.0–100.0)

## 2010-10-04 LAB — BASIC METABOLIC PANEL
BUN: 7 mg/dL (ref 6–23)
CO2: 23 mEq/L (ref 19–32)
Calcium: 9.3 mg/dL (ref 8.4–10.5)
GFR calc non Af Amer: >60 mL/min (ref >60)
Glucose, Bld: 120 mg/dL — ABNORMAL HIGH (ref 70–99)

## 2010-10-09 NOTE — Op Note (Signed)
Marcia [name], Marcia [name]                 ACCOUNT NO.:  [id number]  MEDICAL RECORD NO.:  [id number]           PATIENT TYPE:  I  LOCATION:  2108                         FACILITY:  [facility]  PHYSICIAN:  Gabrielle Dare. Janee Morn, M.D.DATE OF BIRTH:  [birth year]  DATE OF PROCEDURE: DATE OF DISCHARGE:                              OPERATIVE REPORT   PREOPERATIVE DIAGNOSIS:  Acute appendicitis.  POSTOPERATIVE DIAGNOSIS:  Perforated appendicitis.  PROCEDURE:  Laparoscopic appendectomy.  SURGEON:  Gabrielle Dare. Janee Morn, MD  ANESTHESIA:  General endotracheal.  DRAINS:  19-French Harrison Mons.  ESTIMATED BLOOD LOSS:  25 mL.  HISTORY OF PRESENT ILLNESS:  Marcia [name] is a 60 year old white female who presented to the emergency department today with 1 day history of generalized gradually moving to right lower quadrant abdominal pain.  CT scan of the abdomen and pelvis was consistent with acute appendicitis. She was tender in the right lower quadrant.  She was brought to the operating room for emergent appendectomy.  PROCEDURE IN DETAIL:  Informed consent was obtained.  The patient was identified in preop holding area.  Her AICD was turned off.  She received intravenous antibiotics.  She was identified in the preop holding area.  Informed consent was obtained.  She was taken to the operating room.  General endotracheal anesthesia was administered by the Anesthesia staff.  Please note, the patient also received 2 units of FFP to correct her anticoagulation for her atrial fibrillation.  Her abdomen was prepped and draped in a sterile fashion.  We did time-out procedure. Next, the infraumbilical region was infiltrated with 0.25% Marcaine with epinephrine.  Infraumbilical incision was made.  Subcutaneous tissues were dissected down revealing the anterior fascia.  This was divided sharply along the midline and the peritoneal cavity was entered under direct vision without difficulty.  A 0 Vicryl pursestring suture  was placed on the fascial opening.  The Hasson trocar was inserted into the abdomen.  The abdomen was insufflated with carbon dioxide in standard fashion.  Under direct vision, a 12-mm left lower quadrant and a 5-mm right midabdomen port were placed.  Marcaine 0.25% was used at each port site.  Laparoscopic exploration revealed a very inflamed appendix.  The base was identified.  Some lateral attachments of the cecum were taken down from the sidewall of the abdomen allowing good visualization of the base, which was intact.  The appendix traced up anteriorly over the cecum where it was locally perforated and very inflamed.  The base was then further dissected and divided with Endo-GIA with a vascular load. The appendix was then gradually teased off the cecum.  The mesoappendix was divided with the harmonic scalpel achieving good hemostasis.  The appendix was adhesed to the rest away off the cecum and placed in EndoCatch bag and removed from the abdomen via the left lower quadrant port site.  It did have a large perforation near the tip.  The area was then copiously irrigated with several liters of saline.  Hemostasis was obtained of the mesoappendix and the area.  The staple line on the base was intact.  Irrigation fluid was evacuated and returned  clear.  A 19- [place] Blake drain was placed down in the right lower quadrant and brought out through the right-sided port site.  Remainder of the irrigation fluid was evacuated.  The ports removed under direct vision. Pneumoperitoneum was released.  The infraumbilical fascia was closed by tying the 0 Vicryl pursestring suture with care not to trap any intra- abdominal contents.  The two remaining wounds were copiously irrigated. The skin of each was closed with running 4-0 Vicryl subcuticular stitch followed by Dermabond.  Sponge, needle, and instrument counts were all correct.  The patient tolerated procedure well without apparent complication.   The 19-French Harrison Mons drain was secured with a 2-0 nylon. Sterile drain sponge was applied.  The patient tolerated procedure well without apparent complications and was taken to recovery room in stable condition.  All counts were correct.     Gabrielle Dare Janee Morn, M.D.     BET/MEDQ  D:  09/30/2010  T:  10/01/2010  Job:  [id number]  cc:   Marcene Duos, M.D.  Electronically Signed by Violeta Gelinas M.D. on 10/09/2010 04:16:01 PM

## 2010-10-27 ENCOUNTER — Encounter (INDEPENDENT_AMBULATORY_CARE_PROVIDER_SITE_OTHER): Payer: Self-pay | Admitting: General Surgery

## 2010-10-27 NOTE — H&P (Signed)
[name], [name]                 ACCOUNT NO.:  [id number]  MEDICAL RECORD NO.:  [id number]           PATIENT TYPE:  LOCATION:                                 FACILITY:  PHYSICIAN:  Gabrielle Dare. Janee Morn, M.D.DATE OF BIRTH:  11-Nov-1950  DATE OF ADMISSION: DATE OF DISCHARGE:                             HISTORY & PHYSICAL   CHIEF COMPLAINT:  Abdominal pain.  HISTORY OF PRESENT ILLNESS:  Marcia [name] is a 60 year old female who presented to the emergency department with complaint of abdominal pain primarily in the right side since last evening.  She denies any fever. She does report some nausea and vomiting and loss of appetite.  She has not had any change in her bowel function.  She denies any prior abdominal pain, certainly not as severe as this.  She denies any prior travel history or trauma to the abdomen and she denies any known or chronic gastrointestinal disorders.  She presented to the emergency department where workup included labs and showed an elevated white blood cell count as well as CT scan showing periappendiceal stranding and inflammatory changes and dilated fluid-filled appendix consistent with appendicitis.  Therefore, the patient is a surgical candidate and we have been contacted.  Past medical history is extensive and significant for acute on chronic heart failure, class II to III, ischemic cardiomyopathy with ejection fraction of 30% to 35%, implantable cardioverter-defibrillator.  History of proximal atrial fibrillation, maintaining sinus rhythm.  Chronic anticoagulation on Coumadin.  She also has diet-controlled diabetes mellitus, hypertension, rheumatoid arthritis, posttraumatic stress disorder, and generalized anxiety disorder.  SOCIAL HISTORY:  The patient is single.  She does have a child.  She continues to smoke approximately half a pack of cigarettes a day. Denies any alcohol use.  FAMILY HISTORY:  Noncontributory at the present case.  Allergies  include ATROVENT and FLOVENT.  Current medications include: 1. Lisinopril 20 mg one-half tablet twice daily. 2. Amiodarone 200 mg q.a.m. 3. Coumadin 5 mg as directed. 4. Trazodone 50 mg 2 tablets at bedtime. 5. Zoloft 100 mg q.a.m. 6. Metoprolol 25 mg p.o. twice daily. 7. Flonase nasal spray as directed. 8. Simvastatin 20 mg one-half tablet at bedtime. 9. Lasix 40 mg daily. 10.Imdur 30 mg daily. 11.Aspirin 325 mg one-half tablet daily. 12.Protonix 40 mg daily. 13.Potassium 20 mEq 2 tablets daily. 14.Proventil inhaler as directed. 15.Vitamin B12, D3, and vitamin E supplementation as directed.  REVIEW OF SYSTEMS:  Please see history present illness for pertinent findings, otherwise complete 12-system review found negative.  PHYSICAL EXAMINATION:  GENERAL:  A 60 year old obese Caucasian female who does not appear toxic.  She is in mild-to-moderate discomfort and distress. CURRENT VITAL SIGNS:  Temperature of 101.5, heart rate of 88, respiratory rate of 16, blood pressure of 98/64, oxygen saturation 96% to 100% on room air. ENT:  Unremarkable. NECK:  Supple without lymphadenopathy. LUNGS:  Clear to auscultation.  No wheezes, rhonchi, or rales.  Normal respiratory effort without use of accessory muscles. HEART:  Regular rate and rhythm.  No murmurs, gallops, or rubs. Carotids 2+ brisk without bruit.  Peripheral pulses slightly diminished but good capillary  refill. ABDOMEN:  Soft, nondistended.  She is tender in the right lower quadrant with positive rebound.  Hypoactive bowel sounds. RECTAL:  Deferred. EXTREMITIES:  Good active range of motion.  All extremities without crepitus or pain.  No significant edema is appreciated. NEUROLOGIC:  The patient is alert and oriented x3.  DIAGNOSTICS:  CBC in the emergency department shows white blood cell count of 14.2, hemoglobin 12.9, hematocrit 37.8, platelet count of 272,000.  Metabolic panel shows a sodium of 135, potassium 3.7,  chloride of 101, CO2 of 21, BUN of 17, creatinine of 0.9, glucose of 136.  Liver enzymes within normal limits.  Coag panel shows INR elevated at 1.65. Urinalysis otherwise negative.  IMAGING:  CT scan of the abdomen and pelvis shows inflammatory changes in the right lower quadrant with a dilated fluid-filled appendix.  No discrete abscess was seen on the scan.  These findings were consistent with acute appendicitis.  IMPRESSION: 1. Acute appendicitis. 2. Significant comorbid cardiac disease processes as noted in the past     medical history, all currently stable.  PLAN:  We will admit the patient, begin IV Invanz 1 g IV q.24 h.  I have asked [facility] to evaluate the patient as she is a patient of their service and to help [place] in the perioperative period. The patient will need urgent appendectomy.  The procedure including risks, complications, and postoperative expectations have been explained to the patient by myself as well as Dr. Violeta Gelinas.  She is agreeable to our plan.     Brayton El, PA-C   ______________________________ Gabrielle Dare. Janee Morn, M.D.    KB/MEDQ  D:  10/01/2010  T:  10/02/2010  Job:  [id number]  Electronically Signed by Brayton El  on 10/20/2010 02:08:55 PM Electronically Signed by Violeta Gelinas M.D. on 10/27/2010 01:56:14 PM

## 2010-10-30 NOTE — Discharge Summary (Signed)
  Marcia [name], Marcia [name]                 ACCOUNT NO.:  [id number]  MEDICAL RECORD NO.:  [id number]           PATIENT TYPE:  I  LOCATION:  4733                         FACILITY:  [facility]  PHYSICIAN:  Adolph Pollack, M.D.DATE OF BIRTH:  [birth year]  DATE OF ADMISSION:  09/30/2010 DATE OF DISCHARGE:  10/04/2010                              DISCHARGE SUMMARY   HISTORY OF PRESENT ILLNESS:  Ms. Ferran is a 60 year old female who presented with complaint of abdominal pain.  She does have a history of chronic heart failure, ischemic cardiomyopathy with a history of implantable cardioverter defibrillator.  She also has a history of atrial fibrillation but maintained sinus rhythm.  After her workup in the emergency department, identified the patient to have acute appendicitis.  Decision was made to admit the patient for operative management by Dr. Violeta Gelinas.  SUMMARY OF HOSPITAL COURSE:  The patient was admitted on October 01, 2010. She was evaluated by Cardiology given her underlying history who assisted with management of her cardiac problems throughout her perioperative course.  She was taken to the operating room on October 01, 2010, and underwent laparoscopic appendectomy without complication. Postoperatively, again the cardiologists were helpful in managing and keeping her cardiac problems stable.  She eventually was able to tolerate diet, got rid of her Foley, she could void on her own, and eventually the patient was determined to be stable for discharge as of postop day #4 without any significant cardiac findings, however, her path report did show adenocarcinoma.  This was discussed with the patient who will follow up in clinic appropriately.  She otherwise will resume her home medications including Coumadin for atrial fibrillation.  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  DISCHARGE MEDICATIONS:  The patient will be given  prescription for pain medication 1 p.o. q.6 hours p.r.n. pain.  She will resume all home medications including: 1. Gabapentin 300 mg daily. 2. Omeprazole 40 mg daily. 3. Simvastatin 20 mg 1/2 a tablet daily. 4. Zoloft 100 mg daily. 5. Coumadin 5 mg 1-1-1/2 tablets as directed. 6. Vitamin E, vitamin D, vitamin B12 as directed. 7. Ventolin 2 puffs every 4 hours as needed. 8. Metoprolol 25 mg twice daily. 9. Lisinopril 20 mg twice daily. 10.Klor-Con 20 mEq 1-2 tablets twice daily. 11.Imdur 30 mg daily. 12.Lasix 40 mg twice daily. 13.Flonase 2 sprays nasally daily. 14.Aspirin 325 mg 1/2 tablet every morning. 15.Amiodarone 200 mg daily.  She was given preprinted discharge instructions and followup will be in our office in approximately 1-2 days to discuss postoperative course and findings with Dr. Janee Morn.     Brayton El, PA-C   ______________________________ Adolph Pollack, M.D.    KB/MEDQ  D:  10/20/2010  T:  10/21/2010  Job:  [id number]  Electronically Signed by Brayton El  on 10/30/2010 09:05:56 AM Electronically Signed by Avel Peace M.D. on 10/30/2010 10:03:07 AM

## 2010-12-30 IMAGING — CR DG CHEST 2V
2 series · 2 of 2 positions shown · non-contrast
Comparison: 03/05/2009

CLINICAL DATA: Shortness of breath.

CHEST - 2 VIEW

[w chest pa]
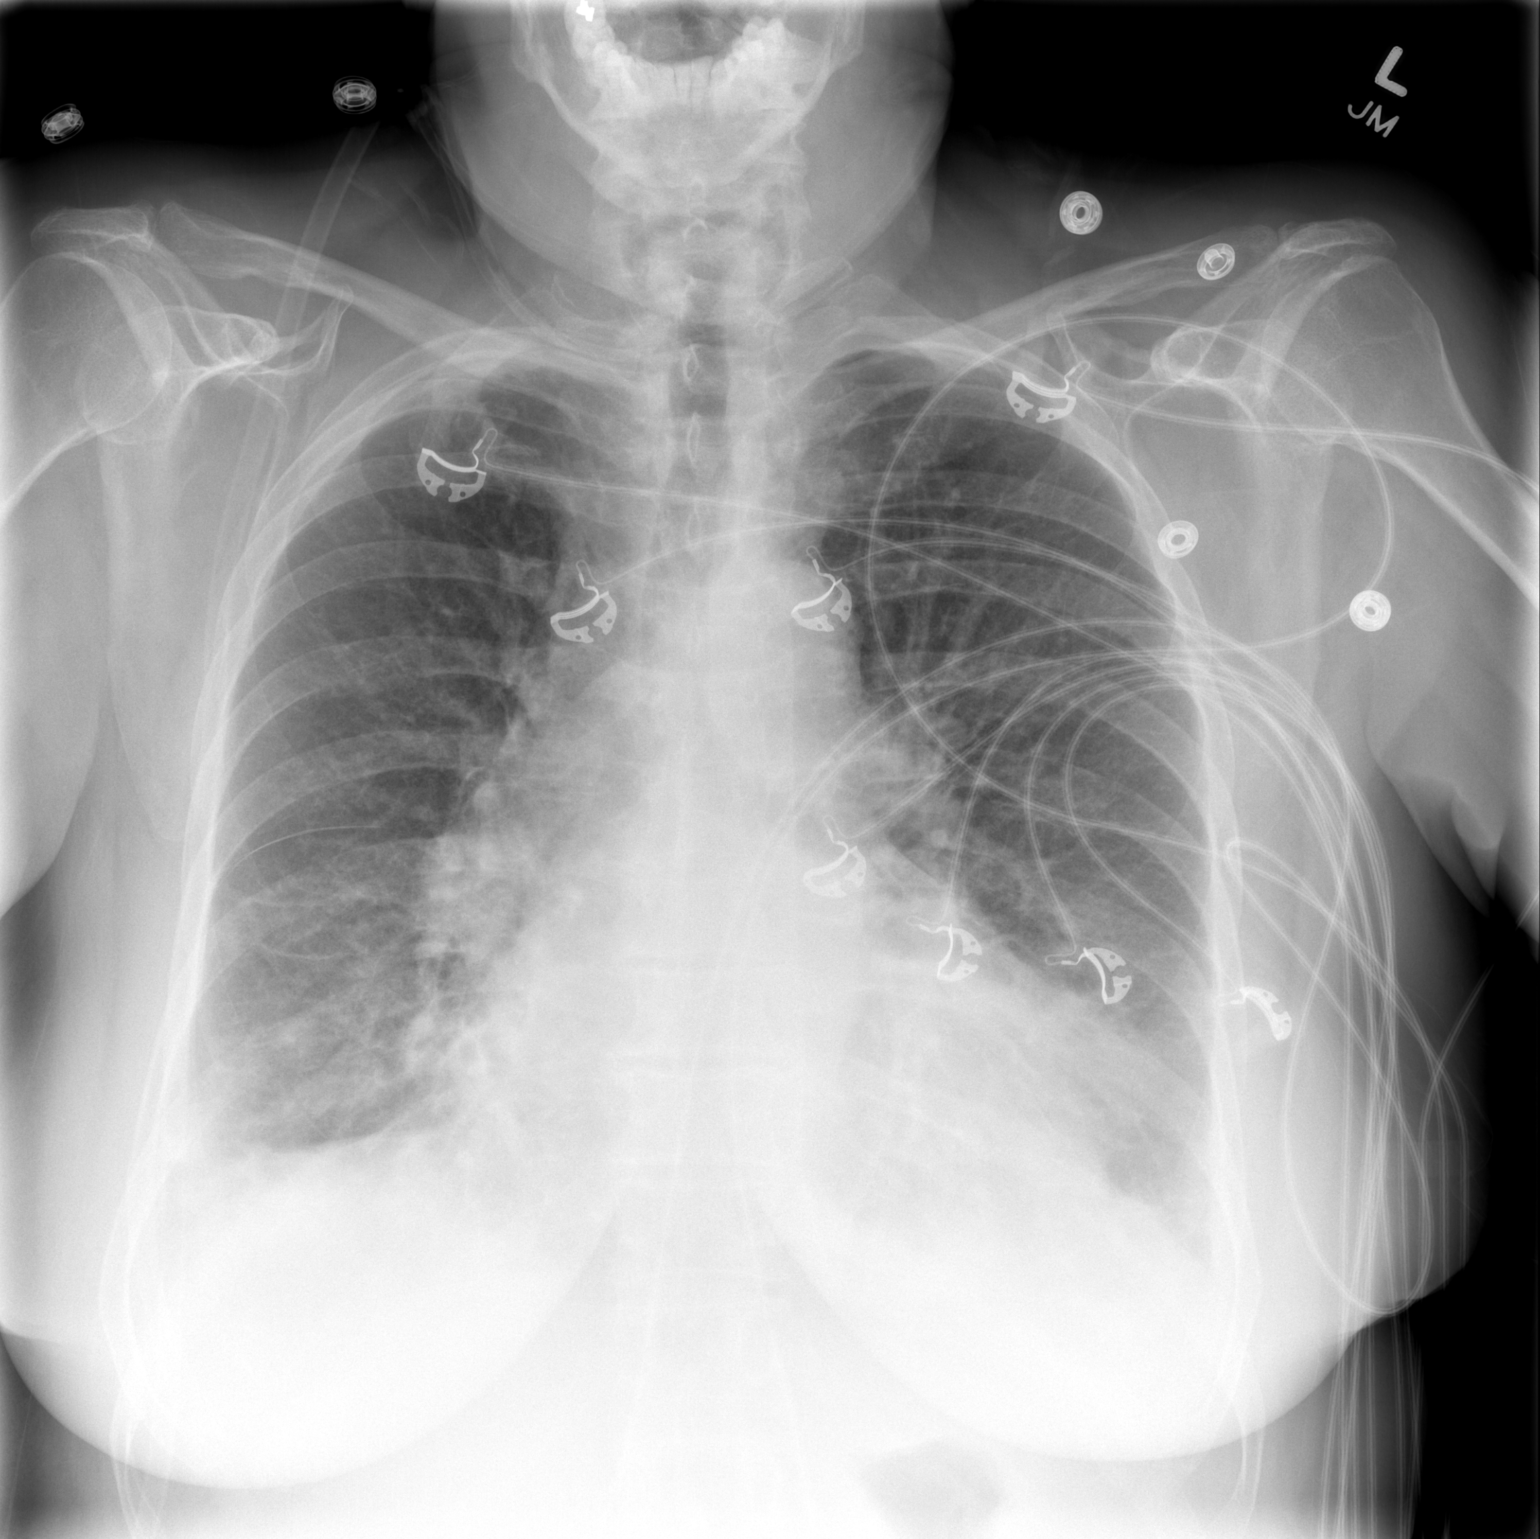

[w chest lat]
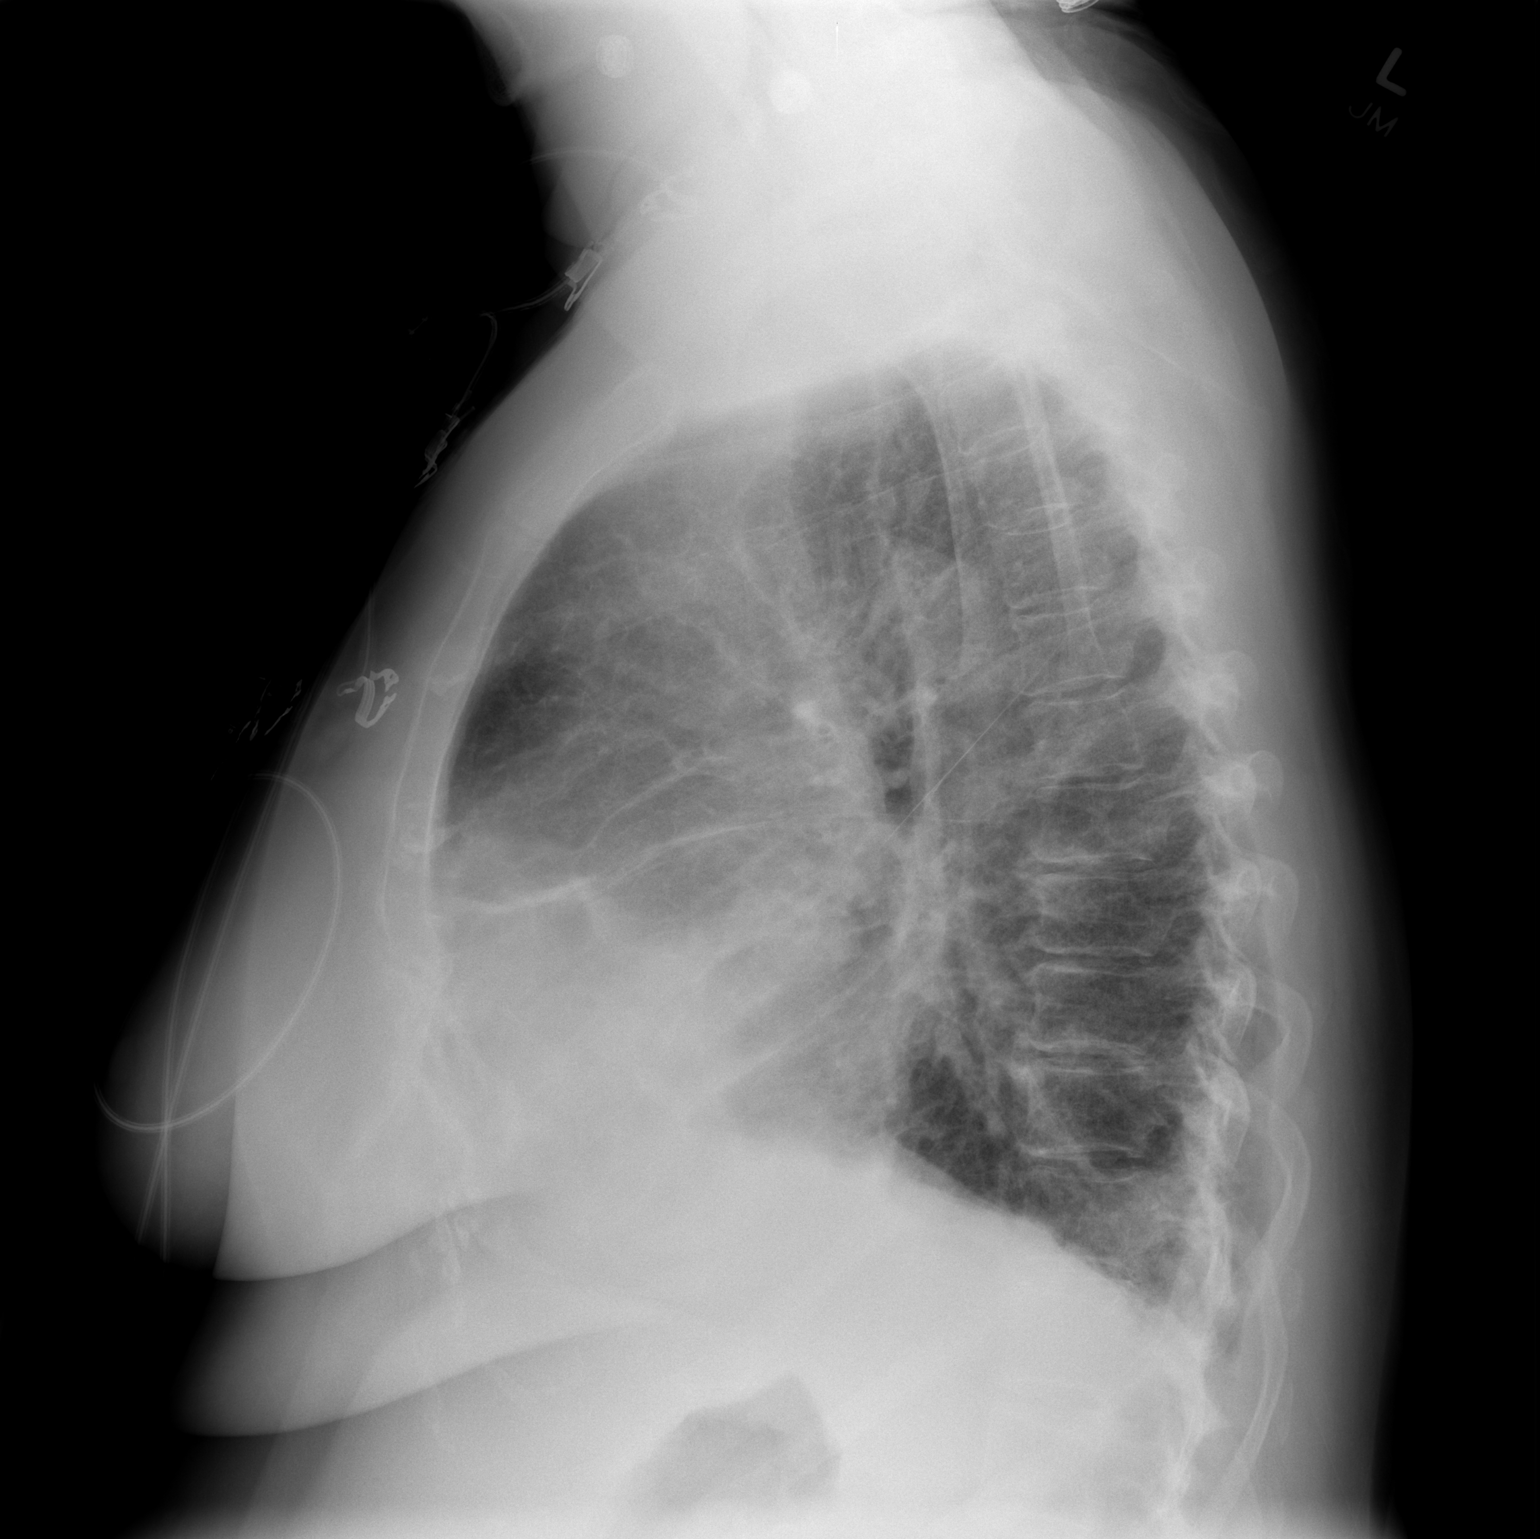

[2 of 2 positions shown; findings below may reference images not displayed]

FINDINGS: The heart is enlarged.  The mediastinal and hilar
contours are stable with prominent pulmonary hila bilaterally.
There is central vascular congestion, mild interstitial edema and
bilateral pleural effusions.
IMPRESSION: Cardiac enlargement with vascular congestion, mild interstitial
edema and bilateral pleural effusions with overlying bibasilar
atelectasis.

## 2011-01-06 ENCOUNTER — Telehealth: Payer: Self-pay | Admitting: *Deleted

## 2011-01-06 NOTE — Telephone Encounter (Signed)
Agree 

## 2011-01-06 NOTE — Telephone Encounter (Signed)
We contacted patient to remind her of appointment with [place] tomorrow for final visit in MADIT RIT Study on 01/07/11 at 1400. Patient called [place] to say she would not be able to make appointment and would have to reschedule Patient is in [place] on family emergency. Patient stated ICD gave her 4 mild shocks yesterday and 3 mild shocks the day before that. Patient states feels okay but feel shock around left arm and around the device. Patient does not have Latitiude device to transmit.I asked patient to go to ER to have device checked or possibly a cardiologist with ICD experience to interrogate device. Patient stated would have it checked and I told her we were available for questions on the device if needed. We will reschedule patient next week in the office when she returns from [place] .

## 2011-01-07 ENCOUNTER — Encounter: Payer: Medicaid Other | Admitting: *Deleted

## 2011-01-13 ENCOUNTER — Ambulatory Visit (INDEPENDENT_AMBULATORY_CARE_PROVIDER_SITE_OTHER): Payer: Medicaid Other | Admitting: *Deleted

## 2011-01-13 DIAGNOSIS — I428 Other cardiomyopathies: Secondary | ICD-10-CM

## 2011-01-13 LAB — ICD DEVICE OBSERVATION
AL IMPEDENCE ICD: 682 Ohm
CHARGE TIME: 8.7 s
DEV-0020ICD: NEGATIVE

## 2011-08-11 ENCOUNTER — Other Ambulatory Visit (HOSPITAL_COMMUNITY): Payer: Self-pay | Admitting: Cardiovascular Disease

## 2011-08-11 DIAGNOSIS — I4891 Unspecified atrial fibrillation: Secondary | ICD-10-CM

## 2011-08-19 ENCOUNTER — Ambulatory Visit (HOSPITAL_COMMUNITY)
Admission: RE | Admit: 2011-08-19 | Discharge: 2011-08-19 | Disposition: A | Discharge status: Home / Self Care | Payer: Medicaid Other | Source: Ambulatory Visit | Attending: Cardiovascular Disease | Admitting: Cardiovascular Disease

## 2011-08-19 DIAGNOSIS — R059 Cough, unspecified: Secondary | ICD-10-CM | POA: Insufficient documentation

## 2011-08-19 DIAGNOSIS — R05 Cough: Secondary | ICD-10-CM | POA: Insufficient documentation

## 2011-08-19 DIAGNOSIS — F17200 Nicotine dependence, unspecified, uncomplicated: Secondary | ICD-10-CM | POA: Insufficient documentation

## 2011-08-19 DIAGNOSIS — I4891 Unspecified atrial fibrillation: Secondary | ICD-10-CM | POA: Insufficient documentation

## 2011-08-19 DIAGNOSIS — R062 Wheezing: Secondary | ICD-10-CM | POA: Insufficient documentation

## 2011-10-31 ENCOUNTER — Emergency Department (HOSPITAL_COMMUNITY)
Admission: EM | Admit: 2011-10-31 | Discharge: 2011-10-31 | Disposition: A | Discharge status: Home / Self Care | Payer: Medicaid Other | Attending: Emergency Medicine | Admitting: Emergency Medicine

## 2011-10-31 ENCOUNTER — Emergency Department (HOSPITAL_COMMUNITY): Payer: Medicaid Other

## 2011-10-31 ENCOUNTER — Encounter (HOSPITAL_COMMUNITY): Payer: Self-pay

## 2011-10-31 DIAGNOSIS — W010XXA Fall on same level from slipping, tripping and stumbling without subsequent striking against object, initial encounter: Secondary | ICD-10-CM | POA: Insufficient documentation

## 2011-10-31 DIAGNOSIS — M25579 Pain in unspecified ankle and joints of unspecified foot: Secondary | ICD-10-CM | POA: Insufficient documentation

## 2011-10-31 DIAGNOSIS — Z7982 Long term (current) use of aspirin: Secondary | ICD-10-CM | POA: Insufficient documentation

## 2011-10-31 DIAGNOSIS — M25476 Effusion, unspecified foot: Secondary | ICD-10-CM | POA: Insufficient documentation

## 2011-10-31 DIAGNOSIS — E7800 Pure hypercholesterolemia, unspecified: Secondary | ICD-10-CM | POA: Insufficient documentation

## 2011-10-31 DIAGNOSIS — Z859 Personal history of malignant neoplasm, unspecified: Secondary | ICD-10-CM | POA: Insufficient documentation

## 2011-10-31 DIAGNOSIS — I10 Essential (primary) hypertension: Secondary | ICD-10-CM | POA: Insufficient documentation

## 2011-10-31 DIAGNOSIS — S82899A Other fracture of unspecified lower leg, initial encounter for closed fracture: Secondary | ICD-10-CM | POA: Insufficient documentation

## 2011-10-31 DIAGNOSIS — M129 Arthropathy, unspecified: Secondary | ICD-10-CM | POA: Insufficient documentation

## 2011-10-31 DIAGNOSIS — Z79899 Other long term (current) drug therapy: Secondary | ICD-10-CM | POA: Insufficient documentation

## 2011-10-31 DIAGNOSIS — J449 Chronic obstructive pulmonary disease, unspecified: Secondary | ICD-10-CM | POA: Insufficient documentation

## 2011-10-31 DIAGNOSIS — J4489 Other specified chronic obstructive pulmonary disease: Secondary | ICD-10-CM | POA: Insufficient documentation

## 2011-10-31 DIAGNOSIS — M25473 Effusion, unspecified ankle: Secondary | ICD-10-CM | POA: Insufficient documentation

## 2011-10-31 DIAGNOSIS — R404 Transient alteration of awareness: Secondary | ICD-10-CM | POA: Insufficient documentation

## 2011-10-31 DIAGNOSIS — F341 Dysthymic disorder: Secondary | ICD-10-CM | POA: Insufficient documentation

## 2011-10-31 DIAGNOSIS — I252 Old myocardial infarction: Secondary | ICD-10-CM | POA: Insufficient documentation

## 2011-10-31 DIAGNOSIS — S82409A Unspecified fracture of shaft of unspecified fibula, initial encounter for closed fracture: Secondary | ICD-10-CM

## 2011-10-31 DIAGNOSIS — M25519 Pain in unspecified shoulder: Secondary | ICD-10-CM | POA: Insufficient documentation

## 2011-10-31 DIAGNOSIS — I4891 Unspecified atrial fibrillation: Secondary | ICD-10-CM | POA: Insufficient documentation

## 2011-10-31 DIAGNOSIS — IMO0002 Reserved for concepts with insufficient information to code with codable children: Secondary | ICD-10-CM | POA: Insufficient documentation

## 2011-10-31 DIAGNOSIS — M79609 Pain in unspecified limb: Secondary | ICD-10-CM | POA: Insufficient documentation

## 2011-10-31 MED ORDER — HYDROCODONE-ACETAMINOPHEN 5-325 MG PO TABS: 1.0000 | ORAL_TABLET | Freq: Once | ORAL | Status: DC

## 2011-10-31 MED ORDER — HYDROCODONE-ACETAMINOPHEN 5-325 MG PO TABS
1.0000 | ORAL_TABLET | Freq: Once | ORAL | Status: AC
  Administered 2011-10-31: 1 via ORAL
  Filled 2011-10-31: qty 1

## 2011-10-31 MED ORDER — HYDROCODONE-ACETAMINOPHEN 5-325 MG PO TABS: 1.0000 | ORAL_TABLET | Freq: Three times a day (TID) | ORAL | Status: AC | PRN

## 2011-10-31 NOTE — ED Notes (Signed)
Patient advises that she fell this morning around 11 am. C/O pain in the right ankle.after the fall she states "I heard a snap"

## 2011-10-31 NOTE — ED Notes (Addendum)
Pt reports she fell approx 1100 this am, c/o multiple pain sites, describes her pain as constant throbbing pain.Pt states "I'm just sleepy, I have been trying to sleep since 7 this morning, pt reports taking "large" amount of Lasix daily and fell while trying to walk to the bathroom."

## 2011-10-31 NOTE — Progress Notes (Signed)
Orthopedic Tech Progress Note Patient Details:  Marcia [name] [birth year] [id number]  Other Ortho Devices Type of Ortho Device: CAM walker   [name], Marcia Bail 10/31/2011, 5:25 PM

## 2011-10-31 NOTE — Discharge Instructions (Signed)
Return here as needed. Follow up with your orthopedist. °

## 2011-10-31 NOTE — ED Notes (Signed)
MD at bedside. EDPA Lawyer

## 2011-11-02 NOTE — ED Provider Notes (Signed)
History     CSN: [id number]  Arrival date & time 10/31/11  1242   First MD Initiated Contact with Patient 10/31/11 1322      Chief Complaint  Patient presents with  . Fall    (Consider location/radiation/quality/duration/timing/severity/associated sxs/prior treatment) HPI Patient presents to emergency department following a trip and fall yesterday.  She states that she tripped over some laundry fall, hurting her right ankle and foot, along with her left second toe.  Patient states that she put on a Cam Walker she had from a previous injury and this seemed to help.  Patient says she did not have a syncopal episode. she denies chest pain, shortness of breath, weakness, nausea, vomiting, abdominal pain, headache, visual changes, dizziness, or fever.  Patient, says she has been taking more Ativan than normal amount, and this is why she is so drowsy. Past Medical History  Diagnosis Date  . Atrial fibrillation   . COPD (chronic obstructive pulmonary disease)   . HBP (high blood pressure)   . Arthritis   . PTSD (post-traumatic stress disorder)   . OCD (obsessive compulsive disorder)   . Anxiety   . Depression   . Cancer     appendix  . High cholesterol   . Heart disease   . Poor circulation   . Heart attack   . Pneumonia   . Bronchitis   . Miscarriage 1985    Past Surgical History  Procedure Date  . Cardiac defibrillator placement may 2011  . Appendectomy april 2012  . Induced abortion [id number]  . Finger surgery 1987    right hand    Family History  Problem Relation Age of Onset  . Stroke Mother   . Heart disease Mother   . Cancer Father   . Asthma Sister   . Cancer Sister     History  Substance Use Topics  . Smoking status: Current Everyday Smoker -- 0.7 packs/day    Types: Cigarettes  . Smokeless tobacco: Not on file  . Alcohol Use: No    OB History    Grav Para Term Preterm Abortions TAB SAB Ect Mult Living                  Review of Systems All  other systems negative except as documented in the HPI. All pertinent positives and negatives as reviewed in the HPI.  Allergies  Atrovent; Flovent; Ipratropium bromide; Nitroglycerin; and Zolpidem tartrate  Home Medications   Current Outpatient Rx  Name Route Sig Dispense Refill  . ALBUTEROL SULFATE (2.5 MG/3ML) 0.083% IN NEBU Nebulization Take 2.5 mg by nebulization every 6 (six) hours as needed. Shortness of breath    . ALPRAZOLAM 0.5 MG PO TABS Oral Take 0.5 mg by mouth 2 (two) times daily as needed. For anxiety    . AMIODARONE HCL 200 MG PO TABS Oral Take 200 mg by mouth daily.      . ASPIRIN 325 MG PO TBEC Oral Take 162.5 mg by mouth daily.     . ATORVASTATIN CALCIUM 40 MG PO TABS Oral Take 40 mg by mouth every evening.    . CHOLECALCIFEROL 1000 UNITS PO CAPS Oral Take 1,000 Units by mouth daily.      Marland Kitchen FLUOXETINE HCL 20 MG PO CAPS Oral Take 20 mg by mouth daily.    Marland Kitchen FLUTICASONE FUROATE 27.5 MCG/SPRAY NA SUSP Nasal Place 2 sprays into the nose daily.     . FUROSEMIDE 40 MG PO TABS Oral Take 40  mg by mouth 2 (two) times daily. Takes 2 tablets twice daily    . GABAPENTIN 300 MG PO CAPS Oral Take 600 mg by mouth every morning.     Marland Kitchen HYDROCODONE-ACETAMINOPHEN 5-325 MG PO TABS Oral Take 1-2 tablets by mouth daily.    Marland Kitchen LISINOPRIL 20 MG PO TABS Oral Take 20 mg by mouth every morning.     Marland Kitchen LORAZEPAM 1 MG PO TABS Oral Take 1 mg by mouth at bedtime.    Marland Kitchen METOLAZONE 2.5 MG PO TABS Oral Take 2.5 mg by mouth every morning.    Marland Kitchen METOPROLOL TARTRATE 25 MG PO TABS Oral Take 25 mg by mouth 2 (two) times daily.      Marland Kitchen OMEPRAZOLE 40 MG PO CPDR Oral Take 40 mg by mouth 2 (two) times daily.     Marland Kitchen POTASSIUM CHLORIDE CRYS ER 20 MEQ PO TBCR Oral Take 20 mEq by mouth 2 (two) times daily. in the am in the afternoon in the evening    . PRESCRIPTION MEDICATION Intramuscular Inject 1 Syringe into the muscle every 30 (thirty) days. Medication kept at doctors office    . VITAMIN B-12 1000  MCG PO TABS Oral Take 1,000 mcg by mouth daily.    Marland Kitchen VITAMIN E 400 UNITS PO CAPS Oral Take 400 Units by mouth daily.      . WARFARIN SODIUM 5 MG PO TABS Oral Take 5-7.5 mg by mouth daily. Monday and Friday takes 1.5 tab Rest of week 1 tablet    . HYDROCODONE-ACETAMINOPHEN 5-325 MG PO TABS Oral Take 1 tablet by mouth every 8 (eight) hours as needed for pain. 12 tablet 0    BP 94/51  Pulse 72  Temp(Src) 97.6 F (36.4 C) (Oral)  Resp 20  SpO2 98%  Physical Exam  Constitutional: She is oriented to person, place, and time. She appears well-developed and well-nourished. No distress.  HENT:  Head: Normocephalic and atraumatic.  Mouth/Throat: Oropharynx is clear and moist.  Eyes: Pupils are equal, round, and reactive to light.  Cardiovascular: Normal rate and regular rhythm.   No murmur heard. Pulmonary/Chest: Effort normal and breath sounds normal. No respiratory distress.  Musculoskeletal:       Right ankle: She exhibits swelling.       Cervical back: She exhibits no tenderness.       Thoracic back: She exhibits no tenderness.       Lumbar back: She exhibits no tenderness.       Feet:  Neurological: She is alert and oriented to person, place, and time.  Skin: Skin is warm and dry.  Psychiatric: She is agitated.    ED Course  Procedures (including critical care time)  Labs Reviewed - No data to display Dg Ankle Complete Right  10/31/2011  *RADIOLOGY REPORT*  Clinical Data: Larey Seat and injured right foot and ankle.  RIGHT ANKLE - COMPLETE 3+ VIEW 10/31/2011:  Comparison: Right foot x-rays obtained concurrently.  No prior ankle imaging.  Findings: Well corticated ossific fragments adjacent to the medial malleolus.  Possible nondisplaced fracture involving the distal fibula above the lateral malleolus, visualized only on the lateral image.  No evidence of acute fracture or dislocation elsewhere. Ankle mortise intact with well-preserved joint space.  No evidence of a joint effusion.  Mild  dorsal soft tissue swelling.  IMPRESSION: Possible nondisplaced fracture involving the distal fibula above the lateral malleolus, visualized only on the lateral image.  No other acute fractures.  Accessory ossicle and/or old  avulsion fracture involving the medial malleolus, with dystrophic calcification in the tibiotalar ligament.  Original Report Authenticated By: Arnell Sieving, M.D.   Dg Shoulder Left  10/31/2011  *RADIOLOGY REPORT*  Clinical Data: Larey Seat and injured left shoulder.  LEFT SHOULDER - 2+ VIEW 10/31/2011:  Comparison: None.  Findings: No evidence of acute fracture or glenohumeral dislocation.  Subacromial space well preserved.  Acromioclavicular joint intact with minimal degenerative changes.  Battery generator pack for the indwelling pacing defibrillator obscures the scapula on the AP images, though the scapula appears intact on the Y image.  IMPRESSION: No acute osseous abnormality.  Original Report Authenticated By: Arnell Sieving, M.D.   Dg Foot Complete Right  10/31/2011  *RADIOLOGY REPORT*  Clinical Data: Larey Seat and injured right foot and ankle.  RIGHT FOOT COMPLETE - 3+ VIEW 10/31/2011:  Comparison: Right ankle x-rays obtained concurrently.  No prior foot imaging.  Findings: Moderate hallux valgus.  Osteopenia.  No evidence of acute fracture or dislocation.  Joint space narrowing involving multiple IP joints of the toes, worst involving the IP joint of the great toe.  Remaining joint spaces well preserved.  IMPRESSION: No acute osseous abnormality.  Osteopenia.  Hallux valgus. 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  Original Report Authenticated By: Arnell Sieving, M.D.   Dg Toe 2nd Left  10/31/2011  *RADIOLOGY REPORT*  Clinical Data: Larey Seat.  Pain and bruising involving the left second toe.  LEFT SECOND TOE 10/31/2011:  Comparison: Left foot x-rays 05/26/2010.  Findings: No evidence of acute fracture or dislocation.  Joint  spaces well preserved.  Possible osteopenia.  IMPRESSION: No acute osseous abnormality.  Possible osteopenia.  Original Report Authenticated By: Arnell Sieving, M.D.     1. Fibula fracture    Patient is placed in a CAM walker and referred to ortho for follow up. Return here as needed.   MDM  MDM Reviewed: nursing note and vitals Interpretation: x-ray            Carlyle Dolly, PA-C 11/02/11 0116

## 2011-11-12 NOTE — ED Provider Notes (Signed)
Medical screening examination/treatment/procedure(s) were performed by non-physician practitioner and as supervising physician I was immediately available for consultation/collaboration.   [name], MD 11/12/11 2337 

## 2011-12-24 ENCOUNTER — Encounter: Payer: Self-pay | Admitting: *Deleted

## 2012-01-01 ENCOUNTER — Encounter: Payer: Self-pay | Admitting: Internal Medicine

## 2012-01-01 ENCOUNTER — Ambulatory Visit (INDEPENDENT_AMBULATORY_CARE_PROVIDER_SITE_OTHER): Payer: Medicaid Other | Admitting: Internal Medicine

## 2012-01-01 VITALS — BP 100/70 | HR 86 | Ht 66.0 in | Wt 234.0 lb

## 2012-01-01 DIAGNOSIS — Z95810 Presence of automatic (implantable) cardiac defibrillator: Secondary | ICD-10-CM

## 2012-01-01 DIAGNOSIS — I428 Other cardiomyopathies: Secondary | ICD-10-CM

## 2012-01-01 DIAGNOSIS — F341 Dysthymic disorder: Secondary | ICD-10-CM

## 2012-01-01 LAB — ICD DEVICE OBSERVATION
DEVICE MODEL ICD: 149720
HV IMPEDENCE: 45 Ohm
RV LEAD AMPLITUDE: 17.4 mv
RV LEAD IMPEDENCE ICD: 584 Ohm
TZON-0003FASTVT: 300 ms
VENTRICULAR PACING ICD: 1 pct

## 2012-01-01 NOTE — Assessment & Plan Note (Signed)
continure currnet meds  I've encouraged her to follow closely with Dr. Erlinda Hong she is on multiple medications

## 2012-01-01 NOTE — Assessment & Plan Note (Signed)
The patient's device was interrogated.  The information was reviewed. No changes were made in the programming.    

## 2012-01-01 NOTE — Progress Notes (Signed)
HPI  Marcia [name] is a 61 y.o. female followoup for ICD implanted for primary prevention in the context of ischemic cardiomyopathy. She is enrolled in the MADIT_RIT trial  The patient denies changes in chronic SOB; she denies chest pain, edema or palpitations  She has also had atrial fibrillation. This presented itself in October. She was treated with amiodarone and Coumadin. Her amiodarone dose was recently down titrated.   She sees Dr. Erlinda Hong but only infrequently'; he did her amiodarone surveillance recently She continues to smoke   Past Medical History  Diagnosis Date  . Atrial fibrillation   . COPD (chronic obstructive pulmonary disease)   . HBP (high blood pressure)   . Arthritis   . PTSD (post-traumatic stress disorder)   . OCD (obsessive compulsive disorder)   . Anxiety   . Depression   . Cancer     appendix  . High cholesterol   . Heart disease   . Poor circulation   . Heart attack   . Pneumonia   . Bronchitis   . Miscarriage 1985    Past Surgical History  Procedure Date  . Cardiac defibrillator placement may 2011  . Appendectomy april 2012  . Induced abortion [id number]  . Finger surgery 1987    right hand    Current Outpatient Prescriptions  Medication Sig Dispense Refill  . albuterol (PROVENTIL) (2.5 MG/3ML) 0.083% nebulizer solution Take 2.5 mg by nebulization every 6 (six) hours as needed. Shortness of breath      . ALPRAZolam (XANAX) 0.5 MG tablet Take 0.5 mg by mouth 2 (two) times daily as needed. For anxiety      . amiodarone (PACERONE) 200 MG tablet Take 200 mg by mouth daily.        Marland Kitchen aspirin 325 MG EC tablet Take 160 mg by mouth daily.       Marland Kitchen atorvastatin (LIPITOR) 40 MG tablet Take 40 mg by mouth every evening.      . busPIRone (BUSPAR) 5 MG tablet Take 5 mg by mouth Twice daily.      . Cholecalciferol 1000 UNITS capsule Take 1,000 Units by mouth daily.        Marland Kitchen EPIPEN 2-PAK 0.3 MG/0.3ML DEVI as directed.      Marland Kitchen FLUoxetine (PROZAC) 20 MG capsule  Take 40 mg by mouth daily.       . fluticasone (FLONASE) 50 MCG/ACT nasal spray as directed.      . fluticasone (VERAMYST) 27.5 MCG/SPRAY nasal spray Place 2 sprays into the nose daily.       . furosemide (LASIX) 40 MG tablet Take 40 mg by mouth 2 (two) times daily. Takes 2 tablets twice daily      . gabapentin (NEURONTIN) 300 MG capsule Take 600 mg by mouth every morning.       Marland Kitchen HYDROcodone-acetaminophen (NORCO) 5-325 MG per tablet Take 1-3 tablets by mouth daily.       Marland Kitchen lisinopril (PRINIVIL,ZESTRIL) 20 MG tablet Take 20 mg by mouth every morning.       . metolazone (ZAROXOLYN) 2.5 MG tablet Take 2.5 mg by mouth every morning.      . metoprolol tartrate (LOPRESSOR) 25 MG tablet Take 25 mg by mouth 2 (two) times daily.        . ondansetron (ZOFRAN) 4 MG tablet as directed.      . potassium chloride SA (K-DUR,KLOR-CON) 20 MEQ tablet Take 20 mEq by mouth 2 (two) times daily. in the am in the  afternoon in the evening      . promethazine (PHENERGAN) 25 MG tablet Take 25 mg by mouth every 6 (six) hours as needed.      . vitamin B-12 (CYANOCOBALAMIN) 1000 MCG tablet Take 1,000 mcg by mouth daily.      . vitamin E 400 UNIT capsule Take 400 Units by mouth daily.        Marland Kitchen warfarin (COUMADIN) 5 MG tablet Take 5-7.5 mg by mouth daily. Monday and Friday takes 1.5 tab Rest of week 1 tablet      . PRESCRIPTION MEDICATION Inject 1 Syringe into the muscle every 30 (thirty) days. Medication kept at doctors office        Allergies  Allergen Reactions  . Atrovent   . Flovent (Fluticasone Propionate)   . Ipratropium Bromide   . Nitroglycerin (Nitroglycerin)   . Zolpidem Tartrate     Review of Systems negative except from HPI and PMH  Physical Exam BP 100/70  Pulse 86  Ht 5\' 6"  (1.676 m)  Wt 234 lb (106.142 kg)  BMI 37.77 kg/m2 Well developed and well nourished in no acute distress HENT normal E scleral and icterus clear Neck Supple JVP flat; carotids brisk and full Clear to  ausculation regular rate and rhythm, no murmurs gallops or rub Soft with active bowel sounds No clubbing cyanosis none Edema Alert and oriented, grossly normal motor and sensory function Skin Warm and Dry    Assessment and  Plan

## 2012-02-02 ENCOUNTER — Inpatient Hospital Stay (HOSPITAL_COMMUNITY)
Admission: EM | Admit: 2012-02-02 | Discharge: 2012-02-04 | DRG: 291 | Disposition: A | Discharge status: Home / Self Care | Payer: Medicaid Other | Attending: Internal Medicine | Admitting: Internal Medicine

## 2012-02-02 ENCOUNTER — Encounter (HOSPITAL_COMMUNITY): Payer: Self-pay

## 2012-02-02 ENCOUNTER — Emergency Department (HOSPITAL_COMMUNITY): Payer: Medicaid Other

## 2012-02-02 DIAGNOSIS — J441 Chronic obstructive pulmonary disease with (acute) exacerbation: Secondary | ICD-10-CM

## 2012-02-02 DIAGNOSIS — R259 Unspecified abnormal involuntary movements: Secondary | ICD-10-CM

## 2012-02-02 DIAGNOSIS — F341 Dysthymic disorder: Secondary | ICD-10-CM

## 2012-02-02 DIAGNOSIS — D638 Anemia in other chronic diseases classified elsewhere: Secondary | ICD-10-CM | POA: Diagnosis present

## 2012-02-02 DIAGNOSIS — J4489 Other specified chronic obstructive pulmonary disease: Secondary | ICD-10-CM

## 2012-02-02 DIAGNOSIS — M129 Arthropathy, unspecified: Secondary | ICD-10-CM | POA: Diagnosis present

## 2012-02-02 DIAGNOSIS — J449 Chronic obstructive pulmonary disease, unspecified: Secondary | ICD-10-CM

## 2012-02-02 DIAGNOSIS — F411 Generalized anxiety disorder: Secondary | ICD-10-CM | POA: Diagnosis present

## 2012-02-02 DIAGNOSIS — M545 Low back pain, unspecified: Secondary | ICD-10-CM

## 2012-02-02 DIAGNOSIS — I4891 Unspecified atrial fibrillation: Secondary | ICD-10-CM | POA: Diagnosis present

## 2012-02-02 DIAGNOSIS — Z7901 Long term (current) use of anticoagulants: Secondary | ICD-10-CM

## 2012-02-02 DIAGNOSIS — E079 Disorder of thyroid, unspecified: Secondary | ICD-10-CM

## 2012-02-02 DIAGNOSIS — I480 Paroxysmal atrial fibrillation: Secondary | ICD-10-CM

## 2012-02-02 DIAGNOSIS — S99929A Unspecified injury of unspecified foot, initial encounter: Secondary | ICD-10-CM

## 2012-02-02 DIAGNOSIS — R609 Edema, unspecified: Secondary | ICD-10-CM

## 2012-02-02 DIAGNOSIS — E876 Hypokalemia: Secondary | ICD-10-CM | POA: Diagnosis present

## 2012-02-02 DIAGNOSIS — F429 Obsessive-compulsive disorder, unspecified: Secondary | ICD-10-CM

## 2012-02-02 DIAGNOSIS — I5043 Acute on chronic combined systolic (congestive) and diastolic (congestive) heart failure: Principal | ICD-10-CM

## 2012-02-02 DIAGNOSIS — Z8589 Personal history of malignant neoplasm of other organs and systems: Secondary | ICD-10-CM

## 2012-02-02 DIAGNOSIS — G4700 Insomnia, unspecified: Secondary | ICD-10-CM

## 2012-02-02 DIAGNOSIS — I509 Heart failure, unspecified: Secondary | ICD-10-CM

## 2012-02-02 DIAGNOSIS — I428 Other cardiomyopathies: Secondary | ICD-10-CM

## 2012-02-02 DIAGNOSIS — J309 Allergic rhinitis, unspecified: Secondary | ICD-10-CM

## 2012-02-02 DIAGNOSIS — J9620 Acute and chronic respiratory failure, unspecified whether with hypoxia or hypercapnia: Secondary | ICD-10-CM

## 2012-02-02 DIAGNOSIS — I252 Old myocardial infarction: Secondary | ICD-10-CM

## 2012-02-02 DIAGNOSIS — S8990XA Unspecified injury of unspecified lower leg, initial encounter: Secondary | ICD-10-CM

## 2012-02-02 DIAGNOSIS — F17200 Nicotine dependence, unspecified, uncomplicated: Secondary | ICD-10-CM | POA: Diagnosis present

## 2012-02-02 DIAGNOSIS — M2550 Pain in unspecified joint: Secondary | ICD-10-CM

## 2012-02-02 DIAGNOSIS — S92309A Fracture of unspecified metatarsal bone(s), unspecified foot, initial encounter for closed fracture: Secondary | ICD-10-CM

## 2012-02-02 DIAGNOSIS — Z95810 Presence of automatic (implantable) cardiac defibrillator: Secondary | ICD-10-CM

## 2012-02-02 DIAGNOSIS — I2510 Atherosclerotic heart disease of native coronary artery without angina pectoris: Secondary | ICD-10-CM

## 2012-02-02 DIAGNOSIS — R7309 Other abnormal glucose: Secondary | ICD-10-CM

## 2012-02-02 DIAGNOSIS — F3289 Other specified depressive episodes: Secondary | ICD-10-CM | POA: Diagnosis present

## 2012-02-02 DIAGNOSIS — I10 Essential (primary) hypertension: Secondary | ICD-10-CM

## 2012-02-02 DIAGNOSIS — F329 Major depressive disorder, single episode, unspecified: Secondary | ICD-10-CM | POA: Diagnosis present

## 2012-02-02 DIAGNOSIS — Z79899 Other long term (current) drug therapy: Secondary | ICD-10-CM

## 2012-02-02 HISTORY — DX: Presence of automatic (implantable) cardiac defibrillator: Z95.810

## 2012-02-02 HISTORY — DX: Shortness of breath: R06.02

## 2012-02-02 HISTORY — DX: Gastro-esophageal reflux disease without esophagitis: K21.9

## 2012-02-02 HISTORY — DX: Cardiac murmur, unspecified: R01.1

## 2012-02-02 HISTORY — DX: Heart failure, unspecified: I50.9

## 2012-02-02 LAB — CBC
HCT: 30.8 % — ABNORMAL LOW (ref 36.0–46.0)
Hemoglobin: 10.4 g/dL — ABNORMAL LOW (ref 12.0–15.0)
MCV: 93.6 fL (ref 78.0–100.0)
RBC: 3.29 MIL/uL — ABNORMAL LOW (ref 3.87–5.11)
WBC: 8.9 10*3/uL (ref 4.0–10.5)

## 2012-02-02 LAB — BASIC METABOLIC PANEL
CO2: 28 mEq/L (ref 19–32)
Chloride: 102 mEq/L (ref 96–112)
Creatinine, Ser: 0.78 mg/dL (ref 0.50–1.10)
GFR calc Af Amer: >90 mL/min (ref >90)
GFR calc Af Amer: 71 mL/min — ABNORMAL LOW (ref >90)
GFR calc non Af Amer: 62 mL/min — ABNORMAL LOW (ref >90)
Potassium: 2.9 mEq/L — ABNORMAL LOW (ref 3.5–5.1)
Potassium: 4 mEq/L (ref 3.5–5.1)
Sodium: 143 mEq/L (ref 135–145)

## 2012-02-02 LAB — POCT I-STAT 3, ART BLOOD GAS (G3+)
Bicarbonate: 31.5 mEq/L — ABNORMAL HIGH (ref 20.0–24.0)
Bicarbonate: 29.8 mEq/L — ABNORMAL HIGH (ref 20.0–24.0)
Patient temperature: 98.6
TCO2: 33 mmol/L (ref 0–100)
pCO2 arterial: 39.5 mmHg (ref 35.0–45.0)
pH, Arterial: 7.51 — ABNORMAL HIGH (ref 7.350–7.450)
pH, Arterial: 7.601 (ref 7.350–7.450)
pO2, Arterial: 19 mmHg — CL (ref 80.0–100.0)

## 2012-02-02 LAB — CARDIAC PANEL(CRET KIN+CKTOT+MB+TROPI)
CK, MB: 3.3 ng/mL (ref 0.3–4.0)
CK, MB: 2.6 ng/mL (ref 0.3–4.0)
Relative Index: 0.3 (ref 0.0–2.5)
Troponin I: <0.3 ng/mL (ref <0.30)

## 2012-02-02 LAB — URINALYSIS, MICROSCOPIC ONLY
Ketones, ur: 15 mg/dL — AB
Leukocytes, UA: NEGATIVE
Nitrite: NEGATIVE
Protein, ur: NEGATIVE mg/dL
pH: 5.5 (ref 5.0–8.0)

## 2012-02-02 LAB — PRO B NATRIURETIC PEPTIDE: Pro B Natriuretic peptide (BNP): 8720 pg/mL — ABNORMAL HIGH (ref 0–125)

## 2012-02-02 LAB — PROTIME-INR: Prothrombin Time: 26.9 seconds — ABNORMAL HIGH (ref 11.6–15.2)

## 2012-02-02 LAB — MAGNESIUM: Magnesium: 2.1 mg/dL (ref 1.5–2.5)

## 2012-02-02 LAB — POCT I-STAT TROPONIN I

## 2012-02-02 MED ORDER — ALBUTEROL SULFATE (5 MG/ML) 0.5% IN NEBU
INHALATION_SOLUTION | RESPIRATORY_TRACT | Status: AC
  Administered 2012-02-02: 5 mg
  Filled 2012-02-02: qty 1

## 2012-02-02 MED ORDER — METHYLPREDNISOLONE SODIUM SUCC 125 MG IJ SOLR
60.0000 mg | Freq: Two times a day (BID) | INTRAMUSCULAR | Status: DC
  Administered 2012-02-02 – 2012-02-03 (×2): 60 mg via INTRAVENOUS
  Filled 2012-02-02: qty 0.96
  Filled 2012-02-02: qty 2
  Filled 2012-02-02: qty 0.96

## 2012-02-02 MED ORDER — SODIUM CHLORIDE 0.9 % IJ SOLN: 3.0000 mL | INTRAMUSCULAR | Status: DC | PRN

## 2012-02-02 MED ORDER — AMIODARONE HCL 200 MG PO TABS
200.0000 mg | ORAL_TABLET | Freq: Every day | ORAL | Status: DC
  Administered 2012-02-02 – 2012-02-04 (×3): 200 mg via ORAL
  Filled 2012-02-02 (×3): qty 1

## 2012-02-02 MED ORDER — POTASSIUM CHLORIDE CRYS ER 20 MEQ PO TBCR
40.0000 meq | EXTENDED_RELEASE_TABLET | Freq: Two times a day (BID) | ORAL | Status: DC
  Administered 2012-02-02: 40 meq via ORAL
  Filled 2012-02-02: qty 2

## 2012-02-02 MED ORDER — FUROSEMIDE 10 MG/ML IJ SOLN
60.0000 mg | Freq: Two times a day (BID) | INTRAMUSCULAR | Status: DC
  Administered 2012-02-02 – 2012-02-04 (×4): 60 mg via INTRAVENOUS
  Filled 2012-02-02 (×4): qty 6

## 2012-02-02 MED ORDER — LISINOPRIL 20 MG PO TABS
20.0000 mg | ORAL_TABLET | Freq: Every morning | ORAL | Status: DC
  Administered 2012-02-02: 20 mg via ORAL
  Filled 2012-02-02: qty 1

## 2012-02-02 MED ORDER — FUROSEMIDE 10 MG/ML IJ SOLN: 80.0000 mg | Freq: Two times a day (BID) | INTRAMUSCULAR | Status: DC

## 2012-02-02 MED ORDER — METOPROLOL TARTRATE 12.5 MG HALF TABLET
12.5000 mg | ORAL_TABLET | Freq: Two times a day (BID) | ORAL | Status: DC
  Administered 2012-02-02 – 2012-02-04 (×5): 12.5 mg via ORAL
  Filled 2012-02-02 (×7): qty 1

## 2012-02-02 MED ORDER — ALBUTEROL SULFATE (5 MG/ML) 0.5% IN NEBU
5.0000 mg | INHALATION_SOLUTION | Freq: Once | RESPIRATORY_TRACT | Status: AC
  Administered 2012-02-02: 5 mg via RESPIRATORY_TRACT
  Filled 2012-02-02: qty 1

## 2012-02-02 MED ORDER — WARFARIN - PHARMACIST DOSING INPATIENT: Freq: Every day | Status: DC

## 2012-02-02 MED ORDER — BUSPIRONE HCL 5 MG PO TABS
5.0000 mg | ORAL_TABLET | Freq: Two times a day (BID) | ORAL | Status: DC
  Administered 2012-02-02 – 2012-02-04 (×5): 5 mg via ORAL
  Filled 2012-02-02 (×6): qty 1

## 2012-02-02 MED ORDER — ALBUTEROL SULFATE (5 MG/ML) 0.5% IN NEBU
5.0000 mg | INHALATION_SOLUTION | RESPIRATORY_TRACT | Status: DC | PRN
  Filled 2012-02-02: qty 0.5

## 2012-02-02 MED ORDER — ONDANSETRON HCL 4 MG/2ML IJ SOLN
4.0000 mg | Freq: Four times a day (QID) | INTRAMUSCULAR | Status: DC | PRN
  Administered 2012-02-02: 4 mg via INTRAVENOUS
  Filled 2012-02-02: qty 2

## 2012-02-02 MED ORDER — METHYLPREDNISOLONE SODIUM SUCC 125 MG IJ SOLR
125.0000 mg | Freq: Once | INTRAMUSCULAR | Status: AC
  Administered 2012-02-02: 125 mg via INTRAVENOUS
  Filled 2012-02-02: qty 2

## 2012-02-02 MED ORDER — ALBUTEROL SULFATE (5 MG/ML) 0.5% IN NEBU
2.5000 mg | INHALATION_SOLUTION | Freq: Four times a day (QID) | RESPIRATORY_TRACT | Status: DC
  Administered 2012-02-02 (×2): 2.5 mg via RESPIRATORY_TRACT
  Filled 2012-02-02: qty 0.5

## 2012-02-02 MED ORDER — ACETAMINOPHEN 325 MG PO TABS: 650.0000 mg | ORAL_TABLET | ORAL | Status: DC | PRN

## 2012-02-02 MED ORDER — POTASSIUM CHLORIDE CRYS ER 20 MEQ PO TBCR
20.0000 meq | EXTENDED_RELEASE_TABLET | Freq: Two times a day (BID) | ORAL | Status: DC
  Administered 2012-02-03 – 2012-02-04 (×3): 20 meq via ORAL
  Filled 2012-02-02 (×4): qty 1
  Filled 2012-02-02: qty 2
  Filled 2012-02-02: qty 1

## 2012-02-02 MED ORDER — FLUOXETINE HCL 20 MG PO CAPS
20.0000 mg | ORAL_CAPSULE | Freq: Every day | ORAL | Status: DC
  Administered 2012-02-02 – 2012-02-04 (×3): 20 mg via ORAL
  Filled 2012-02-02 (×3): qty 1

## 2012-02-02 MED ORDER — POTASSIUM CHLORIDE CRYS ER 20 MEQ PO TBCR
40.0000 meq | EXTENDED_RELEASE_TABLET | Freq: Once | ORAL | Status: AC
  Administered 2012-02-02: 40 meq via ORAL

## 2012-02-02 MED ORDER — SODIUM CHLORIDE 0.9 % IJ SOLN
3.0000 mL | Freq: Two times a day (BID) | INTRAMUSCULAR | Status: DC
  Administered 2012-02-02 – 2012-02-04 (×3): 3 mL via INTRAVENOUS

## 2012-02-02 MED ORDER — ALBUTEROL SULFATE HFA 108 (90 BASE) MCG/ACT IN AERS: 2.0000 | INHALATION_SPRAY | Freq: Four times a day (QID) | RESPIRATORY_TRACT | Status: DC

## 2012-02-02 MED ORDER — POTASSIUM CHLORIDE CRYS ER 20 MEQ PO TBCR: 20.0000 meq | EXTENDED_RELEASE_TABLET | Freq: Two times a day (BID) | ORAL | Status: DC

## 2012-02-02 MED ORDER — LOSARTAN POTASSIUM 25 MG PO TABS
25.0000 mg | ORAL_TABLET | Freq: Every day | ORAL | Status: DC
  Administered 2012-02-02 – 2012-02-04 (×3): 25 mg via ORAL
  Filled 2012-02-02 (×3): qty 1

## 2012-02-02 MED ORDER — WARFARIN SODIUM 5 MG PO TABS
5.0000 mg | ORAL_TABLET | Freq: Once | ORAL | Status: AC
  Administered 2012-02-02: 5 mg via ORAL
  Filled 2012-02-02: qty 1

## 2012-02-02 MED ORDER — POTASSIUM CHLORIDE 10 MEQ/100ML IV SOLN
10.0000 meq | INTRAVENOUS | Status: DC
  Administered 2012-02-02 (×2): 10 meq via INTRAVENOUS
  Filled 2012-02-02: qty 100
  Filled 2012-02-02: qty 300

## 2012-02-02 MED ORDER — ALPRAZOLAM 0.5 MG PO TABS
0.5000 mg | ORAL_TABLET | Freq: Three times a day (TID) | ORAL | Status: DC | PRN
  Administered 2012-02-03 (×2): 0.5 mg via ORAL
  Filled 2012-02-02 (×3): qty 1

## 2012-02-02 MED ORDER — LORAZEPAM 1 MG PO TABS
1.0000 mg | ORAL_TABLET | Freq: Four times a day (QID) | ORAL | Status: DC | PRN
  Administered 2012-02-02: 1 mg via ORAL
  Filled 2012-02-02: qty 1

## 2012-02-02 MED ORDER — HYDROCODONE-ACETAMINOPHEN 5-325 MG PO TABS
1.0000 | ORAL_TABLET | Freq: Four times a day (QID) | ORAL | Status: DC | PRN
  Administered 2012-02-04: 1 via ORAL
  Filled 2012-02-02: qty 1

## 2012-02-02 MED ORDER — FUROSEMIDE 10 MG/ML IJ SOLN
INTRAMUSCULAR | Status: AC
  Filled 2012-02-02: qty 8

## 2012-02-02 MED ORDER — ASPIRIN 325 MG PO TABS
162.5000 mg | ORAL_TABLET | Freq: Every day | ORAL | Status: DC
  Administered 2012-02-02 – 2012-02-04 (×3): 162.5 mg via ORAL
  Filled 2012-02-02 (×2): qty 0.5
  Filled 2012-02-02: qty 1
  Filled 2012-02-02: qty 0.5

## 2012-02-02 MED ORDER — GABAPENTIN 300 MG PO CAPS
600.0000 mg | ORAL_CAPSULE | Freq: Every morning | ORAL | Status: DC
  Administered 2012-02-02 – 2012-02-04 (×3): 600 mg via ORAL
  Filled 2012-02-02 (×4): qty 2

## 2012-02-02 MED ORDER — FUROSEMIDE 10 MG/ML IJ SOLN
80.0000 mg | Freq: Once | INTRAMUSCULAR | Status: AC
  Administered 2012-02-02: 80 mg via INTRAVENOUS
  Filled 2012-02-02: qty 8

## 2012-02-02 MED ORDER — VITAMIN B-12 1000 MCG PO TABS
1000.0000 ug | ORAL_TABLET | Freq: Every day | ORAL | Status: DC
  Administered 2012-02-02 – 2012-02-04 (×3): 1000 ug via ORAL
  Filled 2012-02-02 (×3): qty 1

## 2012-02-02 NOTE — Progress Notes (Addendum)
Disposition Note.  Marcia [name], is a 61 y.o. female,   MRN: [id number]  -  DOB - [birth year]  Outpatient Primary MD for the patient is OSEI-BONSU,GEORGE, MD   Blood pressure 130/86, pulse 86, temperature 97.9 F (36.6 C), temperature source Oral, resp. rate 26, SpO2 100.00%.  Active Problems:  ISCHEMIC CONGESTIVE CARDIOMYOPATHY  CHRONIC OBSTRUCTIVE PULMONARY DISEASE, ACUTE EXACERBATION  OBSESSIVE-COMPULSIVE DISORDER  DEPRESSION/ANXIETY  Atrial fibrillation  HYPERGLYCEMIA, BORDERLINE   61 yo female with h/o copd, chf (ef 30 - 35 in 2011), PTSD/OCD, afib, AICD placement.  Per patient she became sick yesterday with nausea, increased urination, vomiting, diarrhea and some fever.  Over night it quickly became very difficult for her to breathe.  Rolling onto her left side would improve her breathing.  She still smokes 1/2 pack a day and is not on oxygen at home.  ??Polydipsia???  She has some increased work of breathing. ABG shows Ph of 7.6, PCO2 of 30.2, and PO2 of 75.   BNP is 8000+.  EDP recommended Step Down placement.  She has received 2 nebulizer treatments, Solumedrol 125 mg, and Lasix 80 mg IV in the ED.  I will place a foley cath, replete potassium, and request a step down bed for admission.  I will call [facility] for consultation (as she is their patient),   Algis Downs, PA-C [facility] Pager: [phone]

## 2012-02-02 NOTE — ED Notes (Signed)
Pt complains of sob and chest pain onset last pm, pt very anxious in triage

## 2012-02-02 NOTE — Consult Note (Signed)
Pt. Seen and examined. Agree with the NP/PA-C note as written. 61 yo female with ICM and EF 30-35%, extensive scar on NST, presents with increasing dyspnea, weight gain, LE edema, and exam which suggests wheeze and some dullness at the lung bases. BNP is 8K - consistent with CHF exacerbation +/- COPD exacerbation. Will treat for both. No report of ICD discharge. Agree with medication changes as proposed by Corine Shelter, PA-C.   Given her respiratory co-morbidities, appreciate continued hospitalist co-management during her hospitalization.  Chrystie Nose, MD, [facility] Attending Cardiologist [facility]

## 2012-02-02 NOTE — Progress Notes (Addendum)
ANTICOAGULATION CONSULT NOTE - Initial Consult  Pharmacy Consult for Coumadin Indication: atrial fibrillation  Allergies  Allergen Reactions  . Atrovent Other (See Comments)    Either chest pain or heart races  . Flovent (Fluticasone Propionate) Other (See Comments)    Chest pain or heart races  . Nitroglycerin Other (See Comments)    Doctor told her not to ever use it    Patient Measurements:    Vital Signs: Temp: 97.9 F (36.6 C) (08/06 0819) Temp src: Oral (08/06 0819) BP: 134/96 mmHg (08/06 1107) Pulse Rate: 97  (08/06 1107)  Labs:  Basename 02/02/12 0839  HGB 10.4*  HCT 30.8*  PLT 238  APTT --  LABPROT --  INR --  HEPARINUNFRC --  CREATININE 0.78  CKTOTAL --  CKMB --  TROPONINI --    The CrCl is unknown because both a height and weight (above a minimum accepted value) are required for this calculation.   Medical History: Past Medical History  Diagnosis Date  . Atrial fibrillation   . COPD (chronic obstructive pulmonary disease)   . HBP (high blood pressure)   . Arthritis   . PTSD (post-traumatic stress disorder)   . OCD (obsessive compulsive disorder)   . Anxiety   . Depression   . Cancer     appendix  . High cholesterol   . Heart disease   . Poor circulation   . Heart attack   . Pneumonia   . Bronchitis   . Miscarriage 1985    Medications:  Prescriptions prior to admission  Medication Sig Dispense Refill  . albuterol (PROVENTIL HFA;VENTOLIN HFA) 108 (90 BASE) MCG/ACT inhaler Inhale 2 puffs into the lungs every 6 (six) hours as needed. Shortness of breath      . ALPRAZolam (XANAX) 0.5 MG tablet Take 0.5 mg by mouth 3 (three) times daily as needed. For anxiety      . amiodarone (PACERONE) 200 MG tablet Take 200 mg by mouth daily.        Marland Kitchen aspirin 325 MG tablet Take 162.5 mg by mouth daily.      . busPIRone (BUSPAR) 5 MG tablet Take 5 mg by mouth Twice daily.      . Cholecalciferol 1000 UNITS capsule Take 1,000 Units by mouth daily.          Marland Kitchen EPIPEN 2-PAK 0.3 MG/0.3ML DEVI as directed.      Marland Kitchen FLUoxetine (PROZAC) 20 MG capsule Take 20 mg by mouth daily.       . fluticasone (FLONASE) 50 MCG/ACT nasal spray Place 2 sprays into the nose daily as needed. allergies      . furosemide (LASIX) 40 MG tablet Take 80 mg by mouth 2 (two) times daily. Takes 2 tablets twice daily      . gabapentin (NEURONTIN) 300 MG capsule Take 600 mg by mouth every morning.       Marland Kitchen HYDROcodone-acetaminophen (NORCO) 5-325 MG per tablet Take 1 tablet by mouth 3 (three) times daily as needed. pain      . lisinopril (PRINIVIL,ZESTRIL) 20 MG tablet Take 20 mg by mouth every morning.       . metolazone (ZAROXOLYN) 2.5 MG tablet Take 2.5 mg by mouth 2 (two) times daily.       . metoprolol tartrate (LOPRESSOR) 25 MG tablet Take 25 mg by mouth 2 (two) times daily.        . potassium chloride SA (K-DUR,KLOR-CON) 20 MEQ tablet Take 20 mEq by mouth 2 (two) times  daily.       Marland Kitchen PRESCRIPTION MEDICATION Inject 1 Syringe into the muscle every other day. Allergy shots. Medication kept at doctors office      . promethazine (PHENERGAN) 25 MG tablet Take 25 mg by mouth every 6 (six) hours as needed.      . vitamin B-12 (CYANOCOBALAMIN) 1000 MCG tablet Take 1,000 mcg by mouth daily.      . vitamin E 400 UNIT capsule Take 400 Units by mouth daily.        Marland Kitchen warfarin (COUMADIN) 5 MG tablet Take 5-7.5 mg by mouth daily. Monday and Friday takes 1.5 tab Rest of week 1 tablet        Assessment: 61 yo female on Coumadin PTA for AFib to be continued. Home dose of Coumadin 5 mg daily except 7.5 mg on Mondays and Fridays. Pt states she was due for an INR check at [facility], [place] today. Labs just drawn. Hgb 10.4, plt 238.   Goal of Therapy:  INR 2-3 Monitor platelets by anticoagulation protocol: Yes   Plan:  1. Follow up INR  Maudry Mayhew N 02/02/2012,1:50 PM  Addendum: INR 2.44 today is therapeutic. Will continue dosing per home regimen. Also on amiodarone PTA, which has been  continued.   Plan: Coumadin 5 mg PO x1 @1800  Daily PT/INR; f/u AM labs  Maudry Mayhew, PharmD Clinical Pharmacist Pgr [phone]

## 2012-02-02 NOTE — H&P (Signed)
Marcia [name] is an 61 y.o. female.    PCP: [name], MD  Her cardiologist is Dr. [name] and heart and vascular  Chief Complaint: Shortness of breath since the 2 days  HPI: This is a 61 year old, Caucasian female, with a past history of chronic systolic congestive heart failure, COPD, anxiety disorder, hypertension. She status post AICD placement in 2011. She was in her usual state of health about 2 days ago, when she felt that she was doing poorly. She had nausea. For the nausea she took Phenergan. She denies any vomiting. She denies any abdominal pain. She noticed that her legs were swelling swelling up. She had poor appetite. Could not eat or drink. She did not take any medications that day. Yesterday her shortness of breath started developing in the morning. Initially, it was with exertion, subsequently, at rest. She couldn't lie down flat suggesting orthopnea. She had a cough with dark greenish expectoration. She felt hot, but did not check her temperature. She also had some chills.  She mentions, that she went to her pain clinic on Friday.  Last night she couldn't lie down to sleep and so she slept on her elbow. She had chest pain, which started last night and was located in the retrosternal area. It was achy pain 8/10 in intensity without radiation. Currently, the pain is 3/10 in intensity. She also admits to weight gain of about 10 pounds over the last one month. She admits to dietary indiscretion over the last many weeks with increased salt intake. However, she tells me she has been compliant with her medications. She denies any abdominal pain, or diarrhea. She denies any dizziness, lightheadedness, no syncopal episodes. Patient is somewhat of a poor historian. It was difficult to keep her focused.   Home Medications: Prior to Admission medications   Medication Sig Start Date End Date Taking? Authorizing Provider  albuterol (PROVENTIL HFA;VENTOLIN HFA) 108 (90 BASE)  MCG/ACT inhaler Inhale 2 puffs into the lungs every 6 (six) hours as needed. Shortness of breath   Yes Historical Provider, MD  ALPRAZolam (XANAX) 0.5 MG tablet Take 0.5 mg by mouth 3 (three) times daily as needed. For anxiety   Yes Historical Provider, MD  amiodarone (PACERONE) 200 MG tablet Take 200 mg by mouth daily.     Yes Historical Provider, MD  aspirin 325 MG tablet Take 162.5 mg by mouth daily.   Yes Historical Provider, MD  busPIRone (BUSPAR) 5 MG tablet Take 5 mg by mouth Twice daily. 12/11/11  Yes Historical Provider, MD  Cholecalciferol 1000 UNITS capsule Take 1,000 Units by mouth daily.     Yes Historical Provider, MD  EPIPEN 2-PAK 0.3 MG/0.3ML DEVI as directed. 10/13/11  Yes Historical Provider, MD  FLUoxetine (PROZAC) 20 MG capsule Take 20 mg by mouth daily.    Yes Historical Provider, MD  fluticasone (FLONASE) 50 MCG/ACT nasal spray Place 2 sprays into the nose daily as needed. allergies 12/03/11  Yes Historical Provider, MD  furosemide (LASIX) 40 MG tablet Take 80 mg by mouth 2 (two) times daily. Takes 2 tablets twice daily   Yes Historical Provider, MD  gabapentin (NEURONTIN) 300 MG capsule Take 600 mg by mouth every morning.    Yes Historical Provider, MD  HYDROcodone-acetaminophen (NORCO) 5-325 MG per tablet Take 1 tablet by mouth 3 (three) times daily as needed. pain   Yes Historical Provider, MD  lisinopril (PRINIVIL,ZESTRIL) 20 MG tablet Take 20 mg by mouth every morning.    Yes Historical  Provider, MD  metolazone (ZAROXOLYN) 2.5 MG tablet Take 2.5 mg by mouth 2 (two) times daily.    Yes Historical Provider, MD  metoprolol tartrate (LOPRESSOR) 25 MG tablet Take 25 mg by mouth 2 (two) times daily.     Yes Historical Provider, MD  potassium chloride SA (K-DUR,KLOR-CON) 20 MEQ tablet Take 20 mEq by mouth 2 (two) times daily.    Yes Historical Provider, MD  PRESCRIPTION MEDICATION Inject 1 Syringe into the muscle every other day. Allergy shots. Medication kept at doctors office   Yes  Historical Provider, MD  promethazine (PHENERGAN) 25 MG tablet Take 25 mg by mouth every 6 (six) hours as needed.   Yes Historical Provider, MD  vitamin B-12 (CYANOCOBALAMIN) 1000 MCG tablet Take 1,000 mcg by mouth daily.   Yes Historical Provider, MD  vitamin E 400 UNIT capsule Take 400 Units by mouth daily.     Yes Historical Provider, MD  warfarin (COUMADIN) 5 MG tablet Take 5-7.5 mg by mouth daily. Monday and Friday takes 1.5 tab Rest of week 1 tablet   Yes Historical Provider, MD    Allergies:  Allergies  Allergen Reactions  . Atrovent Other (See Comments)    Either chest pain or heart races  . Flovent (Fluticasone Propionate) Other (See Comments)    Chest pain or heart races  . Nitroglycerin Other (See Comments)    Doctor told her not to ever use it    Past Medical History: Past Medical History  Diagnosis Date  . Atrial fibrillation   . COPD (chronic obstructive pulmonary disease)   . HBP (high blood pressure)   . Arthritis   . PTSD (post-traumatic stress disorder)   . OCD (obsessive compulsive disorder)   . Anxiety   . Depression   . Cancer     appendix  . High cholesterol   . Heart disease   . Poor circulation   . Heart attack   . Pneumonia   . Bronchitis   . Miscarriage 1985    Past Surgical History  Procedure Date  . Cardiac defibrillator placement may 2011  . Appendectomy april 2012  . Induced abortion [phone]  . Finger surgery 1987    right hand    Social History: She lives in [place] by herself. She smokes half-pack of cigarettes on a daily basis. Has almost 40-50-pack-year history of smoking. Denies any alcohol use. No recreational drug use. She he has a cane and walker, but prefers to walk on her own.  Family History:  Family History  Problem Relation Age of Onset  . Stroke Mother   . Heart disease Mother   . Cancer Father   . Asthma Sister   . Cancer Sister     Review of Systems - History obtained from the patient General ROS:  positive for  - malaise Psychological ROS: negative Ophthalmic ROS: negative ENT ROS: negative Allergy and Immunology ROS: negative Hematological and Lymphatic ROS: negative Endocrine ROS: negative Respiratory ROS: as in hpi Cardiovascular ROS: as in hpi Gastrointestinal ROS: negative Genito-Urinary ROS: no dysuria, trouble voiding, or hematuria Musculoskeletal ROS: negative Neurological ROS: no TIA or stroke symptoms Dermatological ROS: negative  Physical Examination Blood pressure 134/96, pulse 97, temperature 97.9 F (36.6 C), temperature source Oral, resp. rate 24, SpO2 97.00%.  General appearance: alert, cooperative, appears stated age and no distress Head: Normocephalic, without obvious abnormality, atraumatic Eyes: conjunctivae/corneas clear. PERRL, EOM's intact. Throat: lips, mucosa, and tongue normal; teeth and gums normal Neck: no adenopathy, no  carotid bruit, no JVD, supple, symmetrical, trachea midline and thyroid not enlarged, symmetric, no tenderness/mass/nodules Back: symmetric, no curvature. ROM normal. No CVA tenderness. Resp: Decreased air entry at the bases with crackles. No wheezing was present. Cardio: regular rate and rhythm, S1, S2 normal, no murmur, click, rub or gallop GI: soft, non-tender; bowel sounds normal; no masses,  no organomegaly Extremities: edema 1+ Pulses: 2+ and symmetric Skin: Skin color, texture, turgor normal. No rashes or lesions Lymph nodes: Cervical, supraclavicular, and axillary nodes normal. Neurologic: Alert and oriented X 3, normal strength and tone. Normal symmetric reflexes. No focal deficits.  Laboratory Data: Results for orders placed during the hospital encounter of 02/02/12 (from the past 48 hour(s))  PRO B NATRIURETIC PEPTIDE     Status: Abnormal   Collection Time   02/02/12  8:39 AM      Component Value Range Comment   Pro B Natriuretic peptide (BNP) 8720.0 (*) 0 - 125 pg/mL   BASIC METABOLIC PANEL     Status: Abnormal    Collection Time   02/02/12  8:39 AM      Component Value Range Comment   Sodium 144  135 - 145 mEq/L    Potassium 2.9 (*) 3.5 - 5.1 mEq/L    Chloride 102  96 - 112 mEq/L    CO2 28  19 - 32 mEq/L    Glucose, Bld 128 (*) 70 - 99 mg/dL    BUN 24 (*) 6 - 23 mg/dL    Creatinine, Ser 1.61  0.50 - 1.10 mg/dL    Calcium 9.2  8.4 - 09.6 mg/dL    GFR calc non Af Amer >90  >90 mL/min    GFR calc Af Amer >90  >90 mL/min   CBC     Status: Abnormal   Collection Time   02/02/12  8:39 AM      Component Value Range Comment   WBC 8.9  4.0 - 10.5 K/uL    RBC 3.29 (*) 3.87 - 5.11 MIL/uL    Hemoglobin 10.4 (*) 12.0 - 15.0 g/dL    HCT 04.5 (*) 40.9 - 46.0 %    MCV 93.6  78.0 - 100.0 fL    MCH 31.6  26.0 - 34.0 pg    MCHC 33.8  30.0 - 36.0 g/dL    RDW 81.1  91.4 - 78.2 %    Platelets 238  150 - 400 K/uL   POCT I-STAT TROPONIN I     Status: Normal   Collection Time   02/02/12  8:52 AM      Component Value Range Comment   Troponin i, poc 0.03  0.00 - 0.08 ng/mL    Comment 3            POCT I-STAT 3, BLOOD GAS (G3+)     Status: Abnormal   Collection Time   02/02/12 10:18 AM      Component Value Range Comment   pH, Arterial 7.510 (*) 7.350 - 7.450    pCO2 arterial 39.5  35.0 - 45.0 mmHg    pO2, Arterial 19.0 (*) 80.0 - 100.0 mmHg    Bicarbonate 31.5 (*) 20.0 - 24.0 mEq/L    TCO2 33  0 - 100 mmol/L    O2 Saturation 35.0      Acid-Base Excess 8.0 (*) 0.0 - 2.0 mmol/L    Patient temperature 37.0 C      Collection site RADIAL, ALLEN'S TEST ACCEPTABLE      Drawn by Designer, television/film set  Sample type ARTERIAL      Comment NOTIFIED PHYSICIAN     POCT I-STAT 3, BLOOD GAS (G3+)     Status: Abnormal   Collection Time   02/02/12 10:54 AM      Component Value Range Comment   pH, Arterial 7.601 (*) 7.350 - 7.450    pCO2 arterial 30.2 (*) 35.0 - 45.0 mmHg    pO2, Arterial 75.0 (*) 80.0 - 100.0 mmHg    Bicarbonate 29.8 (*) 20.0 - 24.0 mEq/L    TCO2 31  0 - 100 mmol/L    O2 Saturation 97.0      Acid-Base Excess 8.0 (*)  0.0 - 2.0 mmol/L    Patient temperature 98.6 F      Collection site RADIAL, ALLEN'S TEST ACCEPTABLE      Drawn by RT      Sample type ARTERIAL     URINALYSIS, WITH MICROSCOPIC     Status: Abnormal   Collection Time   02/02/12 11:30 AM      Component Value Range Comment   Color, Urine YELLOW  YELLOW    APPearance CLEAR  CLEAR    Specific Gravity, Urine 1.009  1.005 - 1.030    pH 5.5  5.0 - 8.0    Glucose, UA NEGATIVE  NEGATIVE mg/dL    Hgb urine dipstick NEGATIVE  NEGATIVE    Bilirubin Urine NEGATIVE  NEGATIVE    Ketones, ur 15 (*) NEGATIVE mg/dL    Protein, ur NEGATIVE  NEGATIVE mg/dL    Urobilinogen, UA 0.2  0.0 - 1.0 mg/dL    Nitrite NEGATIVE  NEGATIVE    Leukocytes, UA NEGATIVE  NEGATIVE    Squamous Epithelial / LPF RARE  RARE     Radiology Reports: Dg Chest Port 1 View  02/02/2012  *RADIOLOGY REPORT*  Clinical Data: Chest pain, shortness of breath.  PORTABLE CHEST - 1 VIEW  Comparison: 03/17/2010  Findings: Left AICD remains in place, unchanged.  Heart is borderline in size.  Mild interstitial prominence could reflect interstitial edema, new since prior study.  No confluent opacities or effusions.  IMPRESSION: Suspect mild interstitial edema.  Original Report Authenticated By: Cyndie Chime, M.D.    Electrocardiogram: EKG shows sinus rhythm at 99 beats per minute. Normal axis. Normal Intervals. She has Q waves in V1, 2, and 3. No acute ST changes are noted otherwise.  Assessment/Plan  Principal Problem:  *Acute-on-chronic respiratory failure, probably mixed acute CHF and COPD exacerbation Active Problems:  OBSESSIVE-COMPULSIVE DISORDER  DEPRESSION/ANXIETY  ESSENTIAL HYPERTENSION  ICM- last EF 30-35% 2D 4/11 with grade 2 diastolic dysfunction  PAF (paroxysmal atrial fibrillation), holding NSR on Amiodarone  CHRONIC OBSTRUCTIVE PULMONARY DISEASE, ACUTE EXACERBATION  COPD, still smokes 1/2 pk day  HYPERGLYCEMIA, BORDERLINE  ICD-[employer] implanted May 2011  CAD,  Ant MI Sept 2010, unsuccessful LAD PCI. Myoview showed scar 4/11  Acute on chronic combined systolic and diastolic heart failure  Chronic anticoagulation   #1 acute respiratory failure, most probably as a result of acute systolic congestive heart failure. She admits to dietary indiscretion and weight gain and leg swelling over the past few days and weeks. Based on an echo from 2011 her EF is about 30-35%. She'll be admitted to the step down unit. She'll be given Lasix. She is already on an ACE inhibitor. Cardiology has been consulted. There may be some benefit to repeating echocardiogram. Strict ins and outs will be checked. CHF pathway. She will need diet education and education regarding CHF.  #  2 possible exacerbation of COPD: This is probably mild. She was given some steroids in the ED. We will give her 60 mg Solu-Medrol twice a day, but this can be tapered down quickly. We keep her on albuterol nebulizers every 6 hours. No need for any antibiotics at this time.  #3 hypokalemia: This will be aggressively repleted. Magnesium levels will be checked.   #4 history of coronary artery disease: Cardiac enzymes will be cycled. She'll be maintained on aspirin.  #5 history of atrial fibrillation: Continue with amiodarone and warfarin. Pharmacy will be asked to dose. PT/INR will be checked today.  #6 anemia: The etiology remains unclear. Probably from chronic disease. Hemoglobin back in April was 9.4. However, she has had hemoglobins between 10 and 13 in the past. Anemia panel will be checked in the morning.  #7 history of anxiety disorder: Xanax will be utilized as needed.  DVT, prophylaxis. She is already on warfarin,  She's a full code.  Further management decisions will depend on results of further testing and patient's response to treatment.  [facility]  [facility] Pager [phone]  02/02/2012, 1:49 PM

## 2012-02-02 NOTE — ED Notes (Signed)
Admit Doctor at bedside.  

## 2012-02-02 NOTE — ED Provider Notes (Signed)
History     CSN: [id number]  Arrival date & time 02/02/12  0818   First MD Initiated Contact with Patient 02/02/12 [phone]      Chief Complaint  Patient presents with  . Chest Pain    HPI Patient presents to the emergency room with complaints of shortness of breath. The symptoms started yesterday. She began feeling discomfort and pain in her chest associated with dyspnea. Patient does have history of heart disease as well as COPD. Patient does continue to smoke. The pain is aching in nature and severe. It is located in the  center of the chest. Patient has not tried taking any of your albuterol Atrovent treatments.   Past Medical History  Diagnosis Date  . Atrial fibrillation   . COPD (chronic obstructive pulmonary disease)   . HBP (high blood pressure)   . Arthritis   . PTSD (post-traumatic stress disorder)   . OCD (obsessive compulsive disorder)   . Anxiety   . Depression   . Cancer     appendix  . High cholesterol   . Heart disease   . Poor circulation   . Heart attack   . Pneumonia   . Bronchitis   . Miscarriage 1985    Past Surgical History  Procedure Date  . Cardiac defibrillator placement may 2011  . Appendectomy april 2012  . Induced abortion [phone]  . Finger surgery 1987    right hand    Family History  Problem Relation Age of Onset  . Stroke Mother   . Heart disease Mother   . Cancer Father   . Asthma Sister   . Cancer Sister     History  Substance Use Topics  . Smoking status: Current Everyday Smoker -- 0.7 packs/day    Types: Cigarettes  . Smokeless tobacco: Not on file  . Alcohol Use: No    OB History    Grav Para Term Preterm Abortions TAB SAB Ect Mult Living                  Review of Systems  All other systems reviewed and are negative.    Allergies  Atrovent; Flovent; and Nitroglycerin  Home Medications   Current Outpatient Rx  Name Route Sig Dispense Refill  . ALBUTEROL SULFATE (2.5 MG/3ML) 0.083% IN NEBU Nebulization  Take 2.5 mg by nebulization every 6 (six) hours as needed. Shortness of breath    . ALPRAZOLAM 0.5 MG PO TABS Oral Take 0.5 mg by mouth 2 (two) times daily as needed. For anxiety    . AMIODARONE HCL 200 MG PO TABS Oral Take 200 mg by mouth daily.      . ATORVASTATIN CALCIUM 40 MG PO TABS Oral Take 40 mg by mouth every evening.    . BUSPIRONE HCL 5 MG PO TABS Oral Take 5 mg by mouth Twice daily.    . CHOLECALCIFEROL 1000 UNITS PO CAPS Oral Take 1,000 Units by mouth daily.      Marland Kitchen EPIPEN 2-PAK 0.3 MG/0.3ML IJ DEVI  as directed.    Marland Kitchen FLUOXETINE HCL 20 MG PO CAPS Oral Take 40 mg by mouth daily.     Marland Kitchen FLUTICASONE PROPIONATE 50 MCG/ACT NA SUSP  as directed.    Marland Kitchen FLUTICASONE FUROATE 27.5 MCG/SPRAY NA SUSP Nasal Place 2 sprays into the nose daily.     . FUROSEMIDE 40 MG PO TABS Oral Take 40 mg by mouth 2 (two) times daily. Takes 2 tablets twice daily    .  GABAPENTIN 300 MG PO CAPS Oral Take 600 mg by mouth every morning.     Marland Kitchen HYDROCODONE-ACETAMINOPHEN 5-325 MG PO TABS Oral Take 1-3 tablets by mouth daily.     Marland Kitchen LISINOPRIL 20 MG PO TABS Oral Take 20 mg by mouth every morning.     Marland Kitchen METOLAZONE 2.5 MG PO TABS Oral Take 2.5 mg by mouth every morning.    Marland Kitchen METOPROLOL TARTRATE 25 MG PO TABS Oral Take 25 mg by mouth 2 (two) times daily.      Marland Kitchen ONDANSETRON HCL 4 MG PO TABS  as directed.    Marland Kitchen POTASSIUM CHLORIDE CRYS ER 20 MEQ PO TBCR Oral Take 20 mEq by mouth 2 (two) times daily. in the am in the afternoon in the evening    . PRESCRIPTION MEDICATION Intramuscular Inject 1 Syringe into the muscle as directed. Medication kept at doctors office    . PROMETHAZINE HCL 25 MG PO TABS Oral Take 25 mg by mouth every 6 (six) hours as needed.    Marland Kitchen VITAMIN B-12 1000 MCG PO TABS Oral Take 1,000 mcg by mouth daily.    Marland Kitchen VITAMIN E 400 UNITS PO CAPS Oral Take 400 Units by mouth daily.      . WARFARIN SODIUM 5 MG PO TABS Oral Take 5-7.5 mg by mouth daily. Monday and Friday takes 1.5 tab Rest of week 1  tablet      BP 142/91  Pulse 99  Temp 97.9 F (36.6 C) (Oral)  Resp 24  SpO2 91%  Physical Exam  Nursing note and vitals reviewed. Constitutional: She appears well-developed and well-nourished. No distress.  HENT:  Head: Normocephalic and atraumatic.  Right Ear: External ear normal.  Left Ear: External ear normal.  Eyes: Conjunctivae are normal. Right eye exhibits no discharge. Left eye exhibits no discharge. No scleral icterus.  Neck: Neck supple. No tracheal deviation present.  Cardiovascular: Normal rate, regular rhythm and intact distal pulses.   Pulmonary/Chest: Accessory muscle usage present. No stridor. Tachypnea noted. No respiratory distress. She has wheezes. She has no rales.  Abdominal: Soft. Bowel sounds are normal. She exhibits no distension. There is no tenderness. There is no rebound and no guarding.  Musculoskeletal: She exhibits no edema and no tenderness.  Neurological: She is alert. She has normal strength. No sensory deficit. Cranial nerve deficit:  no gross defecits noted. She exhibits normal muscle tone. She displays no seizure activity. Coordination normal.  Skin: Skin is warm and dry. No rash noted.  Psychiatric: She has a normal mood and affect.    ED Course  Procedures (including critical care time)  Rate: 99  Rhythm: normal sinus rhythm  QRS Axis: normal  Intervals: normal  ST/T Wave abnormalities: normal  Conduction Disutrbances:none  Narrative Interpretation: low voltage qrd, anterolateral infarct age undetermined  Old EKG Reviewed: none available  Medications  aspirin 325 MG tablet (not administered)  albuterol (PROVENTIL HFA;VENTOLIN HFA) 108 (90 BASE) MCG/ACT inhaler (not administered)  albuterol (PROVENTIL) (5 MG/ML) 0.5% nebulizer solution 5 mg (5 mg Nebulization Given 02/02/12 0856)  methylPREDNISolone sodium succinate (SOLU-MEDROL) 125 mg/2 mL injection 125 mg (125 mg Intravenous Given 02/02/12 0856)  albuterol (PROVENTIL) (5 MG/ML) 0.5%  nebulizer solution (5 mg  Given 02/02/12 1010)  furosemide (LASIX) injection 80 mg (80 mg Intravenous Given 02/02/12 1031)   CRITICAL CARE Performed by: Linwood Dibbles R Total critical care time: 35 Critical care time was exclusive of separately billable procedures and treating other patients. Critical care was  necessary to treat or prevent imminent or life-threatening deterioration. Critical care was time spent personally by me on the following activities: development of treatment plan with patient and/or surrogate as well as nursing, discussions with consultants, evaluation of patient's response to treatment, examination of patient, obtaining history from patient or surrogate, ordering and performing treatments and interventions, ordering and review of laboratory studies, ordering and review of radiographic studies, pulse oximetry and re-evaluation of patient's condition.  Labs Reviewed  PRO B NATRIURETIC PEPTIDE - Abnormal; Notable for the following:    Pro B Natriuretic peptide (BNP) 8720.0 (*)     All other components within normal limits  BASIC METABOLIC PANEL - Abnormal; Notable for the following:    Potassium 2.9 (*)     Glucose, Bld 128 (*)     BUN 24 (*)     All other components within normal limits  CBC - Abnormal; Notable for the following:    RBC 3.29 (*)     Hemoglobin 10.4 (*)     HCT 30.8 (*)     All other components within normal limits  POCT I-STAT 3, BLOOD GAS (G3+) - Abnormal; Notable for the following:    pH, Arterial 7.510 (*)     pO2, Arterial 19.0 (*)     Bicarbonate 31.5 (*)     Acid-Base Excess 8.0 (*)     All other components within normal limits  POCT I-STAT TROPONIN I   Dg Chest Port 1 View  02/02/2012  *RADIOLOGY REPORT*  Clinical Data: Chest pain, shortness of breath.  PORTABLE CHEST - 1 VIEW  Comparison: 03/17/2010  Findings: Left AICD remains in place, unchanged.  Heart is borderline in size.  Mild interstitial prominence could reflect interstitial edema, new  since prior study.  No confluent opacities or effusions.  IMPRESSION: Suspect mild interstitial edema.  Original Report Authenticated By: Cyndie Chime, M.D.     1. COPD (chronic obstructive pulmonary disease)   2. Congestive heart failure       MDM  Patient presents with recurrent chest pain and shortness of breath. I suspect her symptoms are related to a combination of a COPD exacerbation and exacerbation of her congestive heart failure. At this time there does not appear to be evidence of acute cardiac ischemia. She does not have evidence of pneumonia. The blood gases suggestive of a venous sample. She has been started on IV Lasix. We will continue albuterol treatments. She has been given a dose of IV steroids. The patient will be admitted to the hospital to the step down unit. We'll continue to monitor closely        Celene Kras, MD 02/02/12 1036

## 2012-02-02 NOTE — Evaluation (Signed)
Physical Therapy Evaluation Patient Details Name: Marcia [name] MRN: [id number] DOB: [birth year] Today's Date: 02/02/2012 Time: 6213-0865 PT Time Calculation (min): 11 min  PT Assessment / Plan / Recommendation Clinical Impression  PAtient s/p CHF, COPD and ?PNA with decr mobility secondary to poor endurance and decr balance.  Will benefit from PT to address mobility.      PT Assessment  Patient needs continued PT services    Follow Up Recommendations  Home health PT;Supervision - Intermittent    Barriers to Discharge        Equipment Recommendations  None recommended by PT    Recommendations for Other Services     Frequency Min 3X/week    Precautions / Restrictions Precautions Precautions: Fall Restrictions Weight Bearing Restrictions: No   Pertinent Vitals/Pain VSS, No pain      Mobility  Bed Mobility Bed Mobility: Rolling Left;Left Sidelying to Sit;Sitting - Scoot to Edge of Bed Rolling Left: 6: Modified independent (Device/Increase time) Left Sidelying to Sit: 6: Modified independent (Device/Increase time) Sitting - Scoot to Edge of Bed: 6: Modified independent (Device/Increase time) Transfers Transfers: Sit to Stand;Stand to Sit Sit to Stand: 4: Min guard;With upper extremity assist;From bed Stand to Sit: 4: Min guard;With upper extremity assist;To chair/3-in-1;With armrests Details for Transfer Assistance: min guard for stability, pivot transfer only secondary to DOE 3/4 with transfer Ambulation/Gait Ambulation/Gait Assistance: Not tested (comment) Stairs: No Wheelchair Mobility Wheelchair Mobility: No         PT Diagnosis: Generalized weakness  PT Problem List: Decreased activity tolerance;Decreased mobility;Decreased safety awareness;Decreased knowledge of use of DME;Cardiopulmonary status limiting activity PT Treatment Interventions: DME instruction;Gait training;Stair training;Functional mobility training;Balance training;Therapeutic exercise;Therapeutic  activities;Patient/family education   PT Goals Acute Rehab PT Goals PT Goal Formulation: With patient Time For Goal Achievement: 02/09/12 Potential to Achieve Goals: Good Pt will go Supine/Side to Sit: Independently PT Goal: Supine/Side to Sit - Progress: Goal set today Pt will Sit at Edge of Bed: Independently PT Goal: Sit at Edge Of Bed - Progress: Goal set today Pt will go Sit to Stand: Independently PT Goal: Sit to Stand - Progress: Goal set today Pt will Transfer Bed to Chair/Chair to Bed: Independently PT Transfer Goal: Bed to Chair/Chair to Bed - Progress: Goal set today Pt will Ambulate: >150 feet;with modified independence;with least restrictive assistive device PT Goal: Ambulate - Progress: Goal set today Pt will Go Up / Down Stairs: 1-2 stairs;with modified independence;with least restrictive assistive device PT Goal: Up/Down Stairs - Progress: Goal set today  Visit Information  Last PT Received On: 02/02/12 Assistance Needed: +1    Subjective Data  Subjective: "I would love to get up." Patient Stated Goal: Go home   Prior Functioning  Home Living Lives With: Alone Available Help at Discharge: Family;Available PRN/intermittently;Neighbor Biomedical engineer lives upstairs and assists with home mgmt) Type of Home: House Home Access: Stairs to enter Secretary/administrator of Steps: 2 Entrance Stairs-Rails: Right Home Layout: One level Bathroom Shower/Tub: Forensic scientist: Standard Home Adaptive Equipment: Environmental consultant - rolling;Straight cane Prior Function Level of Independence: Independent Able to Take Stairs?: Yes Driving: No Vocation: Retired Musician: No difficulties Dominant Hand: Right    Cognition  Overall Cognitive Status: Appears within functional limits for tasks assessed/performed Arousal/Alertness: Awake/alert Orientation Level: Appears intact for tasks assessed Behavior During Session: [facility] for tasks performed      Extremity/Trunk Assessment Right Upper Extremity Assessment RUE ROM/Strength/Tone: [facility] for tasks assessed RUE Sensation: WFL - Light Touch RUE Coordination: WFL -  gross/fine motor Left Upper Extremity Assessment LUE ROM/Strength/Tone: WFL for tasks assessed LUE Sensation: WFL - Light Touch LUE Coordination: WFL - gross/fine motor Right Lower Extremity Assessment RLE ROM/Strength/Tone: WFL for tasks assessed RLE Sensation: WFL - Light Touch RLE Coordination: WFL - gross/fine motor Left Lower Extremity Assessment LLE ROM/Strength/Tone: WFL for tasks assessed LLE Sensation: WFL - Light Touch LLE Coordination: WFL - gross/fine motor Trunk Assessment Trunk Assessment: Normal   Balance    End of Session PT - End of Session Equipment Utilized During Treatment: Gait belt Activity Tolerance: Patient limited by fatigue Patient left: in chair;with call bell/phone within reach Nurse Communication: Mobility status       INGOLD,Ambry Dix 02/02/2012, 3:46 PM  [facility] [phone] (pager)

## 2012-02-02 NOTE — Consult Note (Signed)
Patient ID: Marcia [name] MRN: [id number], DOB/AGE: 02-13-1951   Admit date: 02/02/2012   Primary Physician: [name], MD Primary Cardiologist: Dr Royann Shivers  HPI: 61 y/o with a history of ICM. She had an anterior MI in Sept 2010. PCI was unsuccessful. Myoview showed scar 4/11. Her last EF was 30-35% April 2011. She underwent ICD placement 5/11. She continues to smoke. Over the last 48hrs she says she has "felt bad" with c/o "chill", "fever" and swelling in her ankles. Last night she had increasing SOB and presented to the ER. In the ER her BNP is over 8k. CXR "mild edema, on exam she has significant wheezing.    Problem List: Past Medical History  Diagnosis Date  . Atrial fibrillation   . COPD (chronic obstructive pulmonary disease)   . HBP (high blood pressure)   . Arthritis   . PTSD (post-traumatic stress disorder)   . OCD (obsessive compulsive disorder)   . Anxiety   . Depression   . Cancer     appendix  . High cholesterol   . Heart disease   . Poor circulation   . Heart attack   . Pneumonia   . Bronchitis   . Miscarriage 1985    Past Surgical History  Procedure Date  . Cardiac defibrillator placement may 2011  . Appendectomy april 2012  . Induced abortion [phone]  . Finger surgery 1987    right hand     Allergies:  Allergies  Allergen Reactions  . Atrovent Other (See Comments)    Either chest pain or heart races  . Flovent (Fluticasone Propionate) Other (See Comments)    Chest pain or heart races  . Nitroglycerin Other (See Comments)    Doctor told her not to ever use it     Home Medications  (Not in a hospital admission) See med rec   Family History  Problem Relation Age of Onset  . Stroke Mother   . Heart disease Mother   . Cancer Father   . Asthma Sister   . Cancer Sister      History   Social History  . Marital Status: Single    Spouse Name: N/A    Number of Children: N/A  . Years of Education: N/A   Occupational History  .  Not on file.   Social History Main Topics  . Smoking status: Current Everyday Smoker -- 0.7 packs/day    Types: Cigarettes  . Smokeless tobacco: Not on file  . Alcohol Use: No  . Drug Use:   . Sexually Active:    Other Topics Concern  . Not on file   Social History Narrative  . No narrative on file     Review of Systems: See admission H/P     Physical Exam: Blood pressure 134/96, pulse 97, temperature 97.9 F (36.6 C), temperature source Oral, resp. rate 24, SpO2 97.00%.  General appearance: alert, cooperative, no distress and moderately obese Neck: no carotid bruit and I could appreciate JVD Lungs: decreased breath sounds with bilat wheezing Heart: regular rate and rhythm Abdomen: obese, non tender Extremities: trace edema Pulses: 2+ and symmetric Skin: cool and dry Neurologic: Grossly normal    Labs:   Results for orders placed during the hospital encounter of 02/02/12 (from the past 24 hour(s))  PRO B NATRIURETIC PEPTIDE     Status: Abnormal   Collection Time   02/02/12  8:39 AM      Component Value Range   Pro B Natriuretic peptide (BNP)  8720.0 (*) 0 - 125 pg/mL  BASIC METABOLIC PANEL     Status: Abnormal   Collection Time   02/02/12  8:39 AM      Component Value Range   Sodium 144  135 - 145 mEq/L   Potassium 2.9 (*) 3.5 - 5.1 mEq/L   Chloride 102  96 - 112 mEq/L   CO2 28  19 - 32 mEq/L   Glucose, Bld 128 (*) 70 - 99 mg/dL   BUN 24 (*) 6 - 23 mg/dL   Creatinine, Ser 1.61  0.50 - 1.10 mg/dL   Calcium 9.2  8.4 - 09.6 mg/dL   GFR calc non Af Amer >90  >90 mL/min   GFR calc Af Amer >90  >90 mL/min  CBC     Status: Abnormal   Collection Time   02/02/12  8:39 AM      Component Value Range   WBC 8.9  4.0 - 10.5 K/uL   RBC 3.29 (*) 3.87 - 5.11 MIL/uL   Hemoglobin 10.4 (*) 12.0 - 15.0 g/dL   HCT 04.5 (*) 40.9 - 81.1 %   MCV 93.6  78.0 - 100.0 fL   MCH 31.6  26.0 - 34.0 pg   MCHC 33.8  30.0 - 36.0 g/dL   RDW 91.4  78.2 - 95.6 %   Platelets 238  150 - 400  K/uL  POCT I-STAT TROPONIN I     Status: Normal   Collection Time   02/02/12  8:52 AM      Component Value Range   Troponin i, poc 0.03  0.00 - 0.08 ng/mL   Comment 3           POCT I-STAT 3, BLOOD GAS (G3+)     Status: Abnormal   Collection Time   02/02/12 10:18 AM      Component Value Range   pH, Arterial 7.510 (*) 7.350 - 7.450   pCO2 arterial 39.5  35.0 - 45.0 mmHg   pO2, Arterial 19.0 (*) 80.0 - 100.0 mmHg   Bicarbonate 31.5 (*) 20.0 - 24.0 mEq/L   TCO2 33  0 - 100 mmol/L   O2 Saturation 35.0     Acid-Base Excess 8.0 (*) 0.0 - 2.0 mmol/L   Patient temperature 37.0 C     Collection site RADIAL, ALLEN'S TEST ACCEPTABLE     Drawn by Operator     Sample type ARTERIAL     Comment NOTIFIED PHYSICIAN    POCT I-STAT 3, BLOOD GAS (G3+)     Status: Abnormal   Collection Time   02/02/12 10:54 AM      Component Value Range   pH, Arterial 7.601 (*) 7.350 - 7.450   pCO2 arterial 30.2 (*) 35.0 - 45.0 mmHg   pO2, Arterial 75.0 (*) 80.0 - 100.0 mmHg   Bicarbonate 29.8 (*) 20.0 - 24.0 mEq/L   TCO2 31  0 - 100 mmol/L   O2 Saturation 97.0     Acid-Base Excess 8.0 (*) 0.0 - 2.0 mmol/L   Patient temperature 98.6 F     Collection site RADIAL, ALLEN'S TEST ACCEPTABLE     Drawn by RT     Sample type ARTERIAL       Radiology/Studies: Dg Chest Port 1 View  02/02/2012  *RADIOLOGY REPORT*  Clinical Data: Chest pain, shortness of breath.  PORTABLE CHEST - 1 VIEW  Comparison: 03/17/2010  Findings: Left AICD remains in place, unchanged.  Heart is borderline in size.  Mild interstitial prominence could  reflect interstitial edema, new since prior study.  No confluent opacities or effusions.  IMPRESSION: Suspect mild interstitial edema.  Original Report Authenticated By: Cyndie Chime, M.D.    EKG: NSR with septal Qs  ASSESSMENT AND PLAN:  Principal Problem:  *Acute-on-chronic respiratory failure, probably mixed acute CHF and COPD exacerbation Active Problems:  CHRONIC OBSTRUCTIVE PULMONARY DISEASE,  ACUTE EXACERBATION  Acute on chronic combined systolic and diastolic heart failure  ICM- last EF 30-35% 2D 4/11 with grade 2 diastolic dysfunction  COPD, still smokes 1/2 pk day  OBSESSIVE-COMPULSIVE DISORDER  DEPRESSION/ANXIETY  ESSENTIAL HYPERTENSION  PAF (paroxysmal atrial fibrillation), holding NSR on Amiodarone  HYPERGLYCEMIA, BORDERLINE  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 Myoview showed scar 4/11  Chronic anticoagulation  Plan- Lasix and K+ ordered by primary service. Steroids started, (she has a history of "diet controlled diabetes").. Continue Amiodarone, decrease Metoprolol to 12.5mg  BID and change ACE to ARB. We will follow up this afternoon.  Deland Pretty, PA-C 02/02/2012, 12:02 PM

## 2012-02-02 NOTE — ED Notes (Signed)
Patient placed on nasal cannula.  2 liters of oxygen.

## 2012-02-02 NOTE — Care Management Note (Addendum)
    Page 1 of 2   02/04/2012     5:02:18 PM   CARE MANAGEMENT NOTE 02/04/2012  Patient:  Marcia [name], Marcia [name]   Account Number:  [id number]  Date Initiated:  02/02/2012  Documentation initiated by:  Junius Creamer  Subjective/Objective Assessment:   adm w heart failure     Action/Plan:   lives alone, pcp dr Annia Friendly bonsu   Anticipated DC Date:  02/04/2012   Anticipated DC Plan:  HOME W HOME HEALTH SERVICES      DC Planning Services  CM consult      [facility] Choice  HOME HEALTH   Choice offered to / List presented to:  C-1 Patient        HH arranged  HH-1 RN  HH-10 DISEASE MANAGEMENT  HH-2 PT      [facility] agency  [facility]   Status of service:  Completed, signed off Medicare Important Message given?   (If response is "NO", the following Medicare IM given date fields will be blank) Date Medicare IM given:   Date Additional Medicare IM given:    Discharge Disposition:  HOME W HOME HEALTH SERVICES  Per UR Regulation:  Reviewed for med. necessity/level of care/duration of stay  If discussed at Long Length of Stay Meetings, dates discussed:    Comments:  02/04/12- 1100- Donn Pierini RN, BSN [phone] Pt for d/c later today, spoke with pt at bedside regarding HH recommendation for HH-PT- pt is agreeable to [facility] PT- list of agencies given to pt to review for [facility]. also spoke with pt about CHF management, she states that she knows what to do its just hard when she has to go to the food bank and has limited resources. Offered a HH-RN for CHF management- Pt wants to speak with her daughter be fore she decides.  update- 02/04/12 1630- Donn Pierini- pt's daughter here- in to speak with them to f/u on d/c plans and [facility]- daughter feels like [facility] RN is a good idea- pt is now agreeable to both HH-PT and RN- per daughter they will use [place]- referral sent to [place] via [facility] and call made to [place] with [facility] who confirmed that they can take referral - orders faxed to [place] intake  [phone]) services to begin within 24-48 hr post discharge.  8/6 12:50p debbie dowell rn,bns [phone]

## 2012-02-02 NOTE — ED Notes (Signed)
Decreased potassium to 81ml/hr due to pt c/o burning sensation in vein

## 2012-02-03 DIAGNOSIS — I4891 Unspecified atrial fibrillation: Secondary | ICD-10-CM

## 2012-02-03 LAB — BASIC METABOLIC PANEL
Calcium: 8.8 mg/dL (ref 8.4–10.5)
GFR calc Af Amer: 68 mL/min — ABNORMAL LOW (ref >90)
GFR calc non Af Amer: 59 mL/min — ABNORMAL LOW (ref >90)
Potassium: 3.4 mEq/L — ABNORMAL LOW (ref 3.5–5.1)
Sodium: 142 mEq/L (ref 135–145)

## 2012-02-03 LAB — CARDIAC PANEL(CRET KIN+CKTOT+MB+TROPI)
CK, MB: 2.1 ng/mL (ref 0.3–4.0)
Total CK: 652 U/L — ABNORMAL HIGH (ref 7–177)

## 2012-02-03 LAB — RETICULOCYTES
RBC.: 2.98 MIL/uL — ABNORMAL LOW (ref 3.87–5.11)
Retic Count, Absolute: 62.6 10*3/uL (ref 19.0–186.0)

## 2012-02-03 LAB — IRON AND TIBC: Iron: 28 ug/dL — ABNORMAL LOW (ref 42–135)

## 2012-02-03 LAB — PROTIME-INR: Prothrombin Time: 27.5 seconds — ABNORMAL HIGH (ref 11.6–15.2)

## 2012-02-03 LAB — FOLATE: Folate: 11.8 ng/mL

## 2012-02-03 MED ORDER — PREDNISONE 20 MG PO TABS
40.0000 mg | ORAL_TABLET | Freq: Every day | ORAL | Status: DC
  Administered 2012-02-03 – 2012-02-04 (×2): 40 mg via ORAL
  Filled 2012-02-03 (×3): qty 2

## 2012-02-03 MED ORDER — ALBUTEROL SULFATE (5 MG/ML) 0.5% IN NEBU
2.5000 mg | INHALATION_SOLUTION | Freq: Four times a day (QID) | RESPIRATORY_TRACT | Status: DC | PRN
  Administered 2012-02-03: 2.5 mg via RESPIRATORY_TRACT
  Filled 2012-02-03: qty 0.5

## 2012-02-03 MED ORDER — WARFARIN SODIUM 5 MG PO TABS
5.0000 mg | ORAL_TABLET | Freq: Once | ORAL | Status: AC
  Administered 2012-02-03: 5 mg via ORAL
  Filled 2012-02-03: qty 1

## 2012-02-03 NOTE — Progress Notes (Signed)
Physical Therapy Treatment Patient Details Name: Marcia [name] MRN: [id number] DOB: [birth year] Today's Date: 02/03/2012 Time: 1020-1043 PT Time Calculation (min): 23 min  PT Assessment / Plan / Recommendation Comments on Treatment Session  Pt admitted with CHF/COPD/?PNA and continues to progress with therapy.  Pt able to tolerate greatly increased ambulation distance this am with increased independence.    Follow Up Recommendations  Home health PT;Supervision - Intermittent    Barriers to Discharge        Equipment Recommendations  None recommended by PT    Recommendations for Other Services    Frequency Min 3X/week   Plan Discharge plan remains appropriate;Frequency remains appropriate    Precautions / Restrictions Precautions Precautions: Fall Restrictions Weight Bearing Restrictions: No   Pertinent Vitals/Pain None    Mobility  Bed Mobility Bed Mobility: Supine to Sit;Sit to Supine Supine to Sit: 7: Independent Sit to Supine: 7: Independent Transfers Transfers: Sit to Stand;Stand to Sit (2 trials.) Sit to Stand: 5: Supervision;With upper extremity assist;From bed Stand to Sit: 5: Supervision;With upper extremity assist;To bed Details for Transfer Assistance: Verbal cues for safest hand placement. Ambulation/Gait Ambulation/Gait Assistance: 4: Min guard Ambulation Distance (Feet): 500 Feet Assistive device: None Ambulation/Gait Assistance Details: Guarding for balance with cues for safe/tall posture. Gait Pattern: Decreased trunk rotation;Trunk flexed Stairs: No Wheelchair Mobility Wheelchair Mobility: No    Exercises     PT Diagnosis:    PT Problem List:   PT Treatment Interventions:     PT Goals Acute Rehab PT Goals PT Goal Formulation: With patient Time For Goal Achievement: 02/09/12 Potential to Achieve Goals: Good PT Goal: Supine/Side to Sit - Progress: Met PT Goal: Sit to Stand - Progress: Progressing toward goal PT Goal: Ambulate - Progress:  Progressing toward goal  Visit Information  Last PT Received On: 02/03/12 Assistance Needed: +1    Subjective Data  Subjective: "I am so much better!" Patient Stated Goal: Go home   Cognition  Overall Cognitive Status: Appears within functional limits for tasks assessed/performed Arousal/Alertness: Awake/alert Orientation Level: Appears intact for tasks assessed Behavior During Session: [facility] for tasks performed    Balance  Balance Balance Assessed: No  End of Session PT - End of Session Equipment Utilized During Treatment: Gait belt Activity Tolerance: Patient tolerated treatment well Patient left: in bed;with call bell/phone within reach;with nursing in room Nurse Communication: Mobility status   GP     Cephus Shelling 02/03/2012, 12:17 PM  02/03/2012 Cephus Shelling, PT, DPT [phone]

## 2012-02-03 NOTE — Progress Notes (Signed)
ANTICOAGULATION CONSULT NOTE - Follow up Consult  Pharmacy Consult for Coumadin Indication: atrial fibrillation  Allergies  Allergen Reactions  . Atrovent Other (See Comments)    Either chest pain or heart races  . Flovent (Fluticasone Propionate) Other (See Comments)    Chest pain or heart races  . Nitroglycerin Other (See Comments)    Doctor told her not to ever use it    Patient Measurements: Height: 5\' 6"  (167.6 cm) Weight: 233 lb 11 oz (106 kg) IBW/kg (Calculated) : 59.3   Vital Signs: Temp: 98.7 F (37.1 C) (08/07 0719) Temp src: Oral (08/07 0719) BP: 99/56 mmHg (08/07 0700) Pulse Rate: 60  (08/07 0719)  Labs:  Basename 02/03/12 0455 02/02/12 2115 02/02/12 1402 02/02/12 0839  HGB -- -- -- 10.4*  HCT -- -- -- 30.8*  PLT -- -- -- 238  APTT -- -- -- --  LABPROT 27.5* -- 26.9* --  INR 2.51* -- 2.44* --  HEPARINUNFRC -- -- -- --  CREATININE 1.03 0.99 -- 0.78  CKTOTAL 652* 931* 1362* --  CKMB 2.1 2.6 3.3 --  TROPONINI <0.30 <0.30 <0.30 --    Estimated Creatinine Clearance: 73.3 ml/min (by C-G formula based on Cr of 1.03).   Medical History: Past Medical History  Diagnosis Date  . Atrial fibrillation   . COPD (chronic obstructive pulmonary disease)   . HBP (high blood pressure)   . Arthritis   . PTSD (post-traumatic stress disorder)   . OCD (obsessive compulsive disorder)   . Anxiety   . Depression   . Cancer     appendix  . High cholesterol   . Heart disease   . Poor circulation   . Heart attack   . Pneumonia   . Bronchitis   . Miscarriage 1985  . CHF (congestive heart failure)   . ICD (implantable cardiac defibrillator) in place   . Shortness of breath   . Heart murmur   . GERD (gastroesophageal reflux disease)     Medications:  Prescriptions prior to admission  Medication Sig Dispense Refill  . albuterol (PROVENTIL HFA;VENTOLIN HFA) 108 (90 BASE) MCG/ACT inhaler Inhale 2 puffs into the lungs every 6 (six) hours as needed. Shortness of  breath      . ALPRAZolam (XANAX) 0.5 MG tablet Take 0.5 mg by mouth 3 (three) times daily as needed. For anxiety      . amiodarone (PACERONE) 200 MG tablet Take 200 mg by mouth daily.        Marland Kitchen aspirin 325 MG tablet Take 162.5 mg by mouth daily.      . busPIRone (BUSPAR) 5 MG tablet Take 5 mg by mouth Twice daily.      . Cholecalciferol 1000 UNITS capsule Take 1,000 Units by mouth daily.        Marland Kitchen EPIPEN 2-PAK 0.3 MG/0.3ML DEVI as directed.      Marland Kitchen FLUoxetine (PROZAC) 20 MG capsule Take 20 mg by mouth daily.       . fluticasone (FLONASE) 50 MCG/ACT nasal spray Place 2 sprays into the nose daily as needed. allergies      . furosemide (LASIX) 40 MG tablet Take 80 mg by mouth 2 (two) times daily. Takes 2 tablets twice daily      . gabapentin (NEURONTIN) 300 MG capsule Take 600 mg by mouth every morning.       Marland Kitchen HYDROcodone-acetaminophen (NORCO) 5-325 MG per tablet Take 1 tablet by mouth 3 (three) times daily as needed. pain      .  lisinopril (PRINIVIL,ZESTRIL) 20 MG tablet Take 20 mg by mouth every morning.       . metolazone (ZAROXOLYN) 2.5 MG tablet Take 2.5 mg by mouth 2 (two) times daily.       . metoprolol tartrate (LOPRESSOR) 25 MG tablet Take 25 mg by mouth 2 (two) times daily.        . potassium chloride SA (K-DUR,KLOR-CON) 20 MEQ tablet Take 20 mEq by mouth 2 (two) times daily.       Marland Kitchen PRESCRIPTION MEDICATION Inject 1 Syringe into the muscle every other day. Allergy shots. Medication kept at doctors office      . promethazine (PHENERGAN) 25 MG tablet Take 25 mg by mouth every 6 (six) hours as needed.      . vitamin B-12 (CYANOCOBALAMIN) 1000 MCG tablet Take 1,000 mcg by mouth daily.      . vitamin E 400 UNIT capsule Take 400 Units by mouth daily.        Marland Kitchen warfarin (COUMADIN) 5 MG tablet Take 5-7.5 mg by mouth daily. Monday and Friday takes 1.5 tab Rest of week 1 tablet        Assessment: 61 yo female on Coumadin PTA for AFib to be continued. Home dose of Coumadin 5 mg daily except 7.5 mg  on Mondays and Fridays. INR today 2.51 is therapeutic. Also on amiodarone PTA, which has been continued. No bleeding noted, platelets ok yesterday. Will continue dosing per home regimen.   Goal of Therapy:  INR 2-3 Monitor platelets by anticoagulation protocol: Yes   Plan:  1. Coumadin 5 mg PO x1 at1800 2. Daily PT/INR; f/u AM labs    Maudry Mayhew, PharmD Clinical Pharmacist Pgr [phone] 02/03/2012,11:31 AM

## 2012-02-03 NOTE — Progress Notes (Signed)
  Echocardiogram 2D Echocardiogram has been performed.  Georgian Co 02/03/2012, 3:41 PM

## 2012-02-03 NOTE — Progress Notes (Signed)
Subjective: Breathing better.  Wondering if she can have the Foley catheter removed.  Objective: Vital signs in last 24 hours: Filed Vitals:   02/03/12 0401 02/03/12 0500 02/03/12 0700 02/03/12 0719  BP:   99/56   Pulse:    60  Temp: 97.4 F (36.3 C)   98.7 F (37.1 C)  TempSrc: Oral   Oral  Resp:      Height:      Weight:  106 kg (233 lb 11 oz)    SpO2:    97%   Weight change:   Intake/Output Summary (Last 24 hours) at 02/03/12 0936 Last data filed at 02/03/12 0500  Gross per 24 hour  Intake    677 ml  Output   2000 ml  Net  -1323 ml    Physical Exam: General: Awake, Oriented, No acute distress. HEENT: EOMI. Neck: Supple CV: S1 and S2 Lungs: Scattered wheezing in the right upper lung fields, good air movement bilaterally. Abdomen: Soft, Nontender, Nondistended, +bowel sounds. Ext: Good pulses. Trace edema.  Lab Results: Basic Metabolic Panel:  Lab 02/03/12 1478 02/02/12 2115 02/02/12 0839  NA 142 143 144  K 3.4* 4.0 2.9*  CL 101 101 102  CO2 29 31 28   GLUCOSE 150* 167* 128*  BUN 32* 28* 24*  CREATININE 1.03 0.99 0.78  CALCIUM 8.8 9.2 9.2  MG -- 2.1 --  PHOS -- -- --   Liver Function Tests: No results found for this basename: AST:5,ALT:5,ALKPHOS:5,BILITOT:5,PROT:5,ALBUMIN:5 in the last 168 hours No results found for this basename: LIPASE:5,AMYLASE:5 in the last 168 hours No results found for this basename: AMMONIA:5 in the last 168 hours CBC:  Lab 02/02/12 0839  WBC 8.9  NEUTROABS --  HGB 10.4*  HCT 30.8*  MCV 93.6  PLT 238   Cardiac Enzymes:  Lab 02/03/12 0455 02/02/12 2115 02/02/12 1402  CKTOTAL 652* 931* 1362*  CKMB 2.1 2.6 3.3  CKMBINDEX -- -- --  TROPONINI <0.30 <0.30 <0.30   BNP (last 3 results)  Basename 02/02/12 0839  PROBNP 8720.0*   CBG: No results found for this basename: GLUCAP:5 in the last 168 hours No results found for this basename: HGBA1C:5 in the last 72 hours Other Labs: No components found with this basename:  POCBNP:3 No results found for this basename: DDIMER:2 in the last 168 hours No results found for this basename: CHOL:2,HDL:2,LDLCALC:2,TRIG:2,CHOLHDL:2,LDLDIRECT:2 in the last 168 hours No results found for this basename: TSH,T4TOTAL,FREET3,T3FREE,FREET4,THYROIDAB in the last 168 hours  Lab 02/03/12 0455  VITAMINB12 --  FOLATE --  FERRITIN --  TIBC --  IRON --  RETICCTPCT 2.1    Micro Results: Recent Results (from the past 240 hour(s))  MRSA PCR SCREENING     Status: Normal   Collection Time   02/02/12 12:48 PM      Component Value Range Status Comment   MRSA by PCR NEGATIVE  NEGATIVE Final     Studies/Results: Dg Chest Port 1 View  02/02/2012  *RADIOLOGY REPORT*  Clinical Data: Chest pain, shortness of breath.  PORTABLE CHEST - 1 VIEW  Comparison: 03/17/2010  Findings: Left AICD remains in place, unchanged.  Heart is borderline in size.  Mild interstitial prominence could reflect interstitial edema, new since prior study.  No confluent opacities or effusions.  IMPRESSION: Suspect mild interstitial edema.  Original Report Authenticated By: Cyndie Chime, M.D.    Medications: I have reviewed the patient's current medications. Scheduled Meds:   . albuterol      . amiodarone  200 mg Oral Daily  . aspirin  162.5 mg Oral Daily  . busPIRone  5 mg Oral BID  . FLUoxetine  20 mg Oral Daily  . furosemide  60 mg Intravenous Q12H  . furosemide  80 mg Intravenous Once  . gabapentin  600 mg Oral q morning - 10a  . losartan  25 mg Oral Daily  . methylPREDNISolone (SOLU-MEDROL) injection  60 mg Intravenous Q12H  . metoprolol tartrate  12.5 mg Oral BID  . potassium chloride SA  20 mEq Oral BID  . potassium chloride  40 mEq Oral Once  . sodium chloride  3 mL Intravenous Q12H  . vitamin B-12  1,000 mcg Oral Daily  . warfarin  5 mg Oral ONCE-1800  . Warfarin - Pharmacist Dosing Inpatient   Does not apply q1800  . DISCONTD: albuterol  2 puff Inhalation Q6H  . DISCONTD: albuterol  2.5 mg  Nebulization Q6H  . DISCONTD: furosemide  80 mg Intravenous Q12H  . DISCONTD: lisinopril  20 mg Oral q morning - 10a  . DISCONTD: potassium chloride  10 mEq Intravenous Q1 Hr x 4  . DISCONTD: potassium chloride SA  20 mEq Oral BID  . DISCONTD: potassium chloride  40 mEq Oral BID   Continuous Infusions:  PRN Meds:.acetaminophen, albuterol, ALPRAZolam, HYDROcodone-acetaminophen, ondansetron (ZOFRAN) IV, sodium chloride, DISCONTD: albuterol, DISCONTD: LORazepam  Assessment/Plan: Acute respiratory failure, most probably as a result of acute systolic congestive heart failure with possible COPD exacerbation Admits to dietary indiscretion and weight gain and leg swelling over the past few days and weeks. Based on an echo from 2011 her EF is about 30-35%.  2-D echocardiogram ordered and pending.  Given the improvement in breathing, transfer the patient to the telemetry.  Appreciate cardiology input.  Continue to I/O.  -1.3 L to date.  Possible exacerbation of COPD Mild.  Transition Solu-Medrol to prednisone.  Taper rapidly. We keep her on albuterol nebulizers every 6 hours. No need for any antibiotics at this time.   Hypokalemia Likely due to diuresis.  Replace as needed.  History of coronary artery disease Cardiac enzymes negative. Continue aspirin.   History of atrial fibrillation Continue with amiodarone and warfarin. Pharmacy will be asked to dose.  Rate controlled.  INR therapeutic.  Anemia Etiology unclear. Probably from chronic disease. Anemia panel pending.   History of anxiety disorder Xanax will be utilized as needed.   DVT, prophylaxis On coumadin.  Tobacco abuse Counseled patient on cessation.  Full code.  Disposition Transfer the patient to telemetry.   LOS: 1 day  Chemika Nightengale A, MD 02/03/2012, 9:36 AM

## 2012-02-03 NOTE — Progress Notes (Signed)
Occupational Therapy Evaluation Patient Details Name: Marcia [name] MRN: [id number] DOB: 04-06-1951 Today's Date: 02/03/2012 Time: 1431-1450 OT Time Calculation (min): 19 min  OT Assessment / Plan / Recommendation Clinical Impression  Pt admitted with CHF/COPD/?PNA  thus affecting PLOF. Will benefit from acute OT services before d/c home.    OT Assessment  Patient needs continued OT Services    Follow Up Recommendations  Supervision - Intermittent    Barriers to Discharge      Equipment Recommendations  Other (comment) (TBD by OT)    Recommendations for Other Services    Frequency  Min 2X/week    Precautions / Restrictions Precautions Precautions: Fall Restrictions Weight Bearing Restrictions: No   Pertinent Vitals/Pain See vitals    ADL  Lower Body Dressing: Performed;Supervision/safety (donned bil socks) Where Assessed - Lower Body Dressing: Unsupported sitting Toilet Transfer: Simulated;Min guard Toilet Transfer Method: Sit to [name]: Other (comment) (EOB) Equipment Used: Gait belt Transfers/Ambulation Related to ADLs: min guard for sit<>stand. Pt refusing functional mobility and requesting to return to bed. ADL Comments: Pt reports taking baths in tub at home and that she has no trouble with it because she gets on all fours in tub before standing up.  Discussed with pt safety concern regarding tub tranfser but pt continues to verbalize her technique is safe.  Will conntinue to address use of tub DME. Pt fatigues easily during EOB acitvity. ADL assessement limited by pt requesting to return to bed.    OT Diagnosis: Generalized weakness  OT Problem List: Decreased activity tolerance OT Treatment Interventions: Self-care/ADL training;Energy conservation;DME and/or AE instruction;Therapeutic activities;Patient/family education   OT Goals Acute Rehab OT Goals OT Goal Formulation: With patient Time For Goal Achievement: 02/10/12 Potential to  Achieve Goals: Good ADL Goals Pt Will Transfer to Toilet: with modified independence;Ambulation;Comfort height toilet ADL Goal: Toilet Transfer - Progress: Goal set today Pt Will Perform Tub/Shower Transfer: Tub transfer;with modified independence;Ambulation;with DME ADL Goal: Tub/Shower Transfer - Progress: Goal set today Miscellaneous OT Goals Miscellaneous OT Goal #1: Pt will independently verbalize and demonstrate at least 3 energy conservation techniques during ADL activity. OT Goal: Miscellaneous Goal #1 - Progress: Goal set today  Visit Information  Last OT Received On: 02/03/12 Assistance Needed: +1    Subjective Data      Prior Functioning  Vision/Perception  Home Living Lives With: Alone Available Help at Discharge: Family;Available PRN/intermittently;Neighbor Type of Home: House Home Access: Stairs to enter Secretary/administrator of Steps: 2 Entrance Stairs-Rails: Right Home Layout: One level Bathroom Shower/Tub: Tub/shower unit;Curtain Firefighter: Standard Home Adaptive Equipment: Environmental consultant - rolling;Straight cane Additional Comments: pt takes baths in tub Prior Function Level of Independence: Independent Able to Take Stairs?: Yes Driving: No Vocation: Retired Musician: No difficulties Dominant Hand: Right      Cognition  Overall Cognitive Status: Appears within functional limits for tasks assessed/performed Arousal/Alertness: Awake/alert Orientation Level: Appears intact for tasks assessed Behavior During Session: [facility] for tasks performed    Extremity/Trunk Assessment Right Upper Extremity Assessment RUE ROM/Strength/Tone: [facility] for tasks assessed (3+/5 in elbow and shoulder) RUE Sensation: WFL - Light Touch;WFL - Proprioception RUE Coordination: WFL - gross/fine motor Left Upper Extremity Assessment LUE ROM/Strength/Tone: Within functional levels (3+/5 in elbow and shoulder) LUE Sensation: WFL - Light Touch;WFL -  Proprioception LUE Coordination: WFL - gross/fine motor   Mobility Bed Mobility Bed Mobility: Supine to Sit;Sit to Supine Supine to Sit: 7: Independent Sitting - Scoot to Edge of Bed: 7: Independent  Sit to Supine: 7: Independent Transfers Sit to Stand: 4: Min guard;From bed Stand to Sit: 5: Supervision;To bed Details for Transfer Assistance: Verbal cues for safest hand placement.   Exercise    Balance Balance Balance Assessed: No  End of Session OT - End of Session Equipment Utilized During Treatment: Gait belt Activity Tolerance: Patient limited by fatigue Patient left: in bed;with call bell/phone within reach;with bed alarm set Nurse Communication: Mobility status  GO   02/03/2012 Cipriano Mile OTR/L Pager [phone] Office [phone]   Cipriano Mile 02/03/2012, 4:06 PM

## 2012-02-03 NOTE — Progress Notes (Addendum)
[facility]  Subjective: Feeling better.  She admits to adding extra salt to her diet lately.  Objective: Vital signs in last 24 hours: Temp:  [97.2 F (36.2 C)-98.7 F (37.1 C)] 98.7 F (37.1 C) (08/07 0719) Pulse Rate:  [59-99] 60  (08/07 0719) Resp:  [15-26] 16  (08/07 0400) BP: (93-142)/(56-107) 99/56 mmHg (08/07 0700) SpO2:  [91 %-100 %] 97 % (08/07 0719) Weight:  [106 kg (233 lb 11 oz)-107.5 kg (236 lb 15.9 oz)] 106 kg (233 lb 11 oz) (08/07 0500) Last BM Date: 02/01/12  Intake/Output from previous day: 08/06 0701 - 08/07 0700 In: 677 [P.O.:477; IV Piggyback:200] Out: 2000 [Urine:2000] Intake/Output this shift:    Medications Current Facility-Administered Medications  Medication Dose Route Frequency Provider Last Rate Last Dose  . acetaminophen (TYLENOL) tablet 650 mg  650 mg Oral Q4H PRN Osvaldo Shipper, MD      . albuterol (PROVENTIL) (5 MG/ML) 0.5% nebulizer solution 2.5 mg  2.5 mg Nebulization Q6H PRN Provider Default, MD      . albuterol (PROVENTIL) (5 MG/ML) 0.5% nebulizer solution 5 mg  5 mg Nebulization Once Celene Kras, MD   5 mg at 02/02/12 0856  . albuterol (PROVENTIL) (5 MG/ML) 0.5% nebulizer solution        5 mg at 02/02/12 1010  . ALPRAZolam Prudy Feeler) tablet 0.5 mg  0.5 mg Oral TID PRN Osvaldo Shipper, MD      . amiodarone (PACERONE) tablet 200 mg  200 mg Oral Daily Osvaldo Shipper, MD   200 mg at 02/02/12 1625  . aspirin tablet 162.5 mg  162.5 mg Oral Daily Osvaldo Shipper, MD   162.5 mg at 02/02/12 1822  . busPIRone (BUSPAR) tablet 5 mg  5 mg Oral BID Osvaldo Shipper, MD   5 mg at 02/02/12 2136  . FLUoxetine (PROZAC) capsule 20 mg  20 mg Oral Daily Osvaldo Shipper, MD   20 mg at 02/02/12 1624  . furosemide (LASIX) injection 60 mg  60 mg Intravenous Q12H Osvaldo Shipper, MD   60 mg at 02/02/12 2100  . furosemide (LASIX) injection 80 mg  80 mg Intravenous Once Celene Kras, MD   80 mg at 02/02/12 1031  . gabapentin (NEURONTIN) capsule  600 mg  600 mg Oral q morning - 10a Osvaldo Shipper, MD   600 mg at 02/02/12 1823  . HYDROcodone-acetaminophen (NORCO/VICODIN) 5-325 MG per tablet 1 tablet  1 tablet Oral Q6H PRN Osvaldo Shipper, MD      . losartan (COZAAR) tablet 25 mg  25 mg Oral Daily Eda Paschal [place], [place]   25 mg at 02/02/12 2100  . methylPREDNISolone sodium succinate (SOLU-MEDROL) 125 mg/2 mL injection 125 mg  125 mg Intravenous Once Celene Kras, MD   125 mg at 02/02/12 0856  . methylPREDNISolone sodium succinate (SOLU-MEDROL) 125 mg/2 mL injection 60 mg  60 mg Intravenous Q12H Osvaldo Shipper, MD   60 mg at 02/02/12 2100  . metoprolol tartrate (LOPRESSOR) tablet 12.5 mg  12.5 mg Oral BID Osvaldo Shipper, MD   12.5 mg at 02/02/12 2135  . ondansetron (ZOFRAN) injection 4 mg  4 mg Intravenous Q6H PRN Osvaldo Shipper, MD   4 mg at 02/02/12 1816  . potassium chloride SA (K-DUR,KLOR-CON) CR tablet 20 mEq  20 mEq Oral BID Osvaldo Shipper, MD      . potassium chloride SA (K-DUR,KLOR-CON) CR tablet 40 mEq  40 mEq Oral Once Osvaldo Shipper, MD   40 mEq at 02/02/12  1826  . sodium chloride 0.9 % injection 3 mL  3 mL Intravenous Q12H Osvaldo Shipper, MD   3 mL at 02/02/12 2200  . sodium chloride 0.9 % injection 3 mL  3 mL Intravenous PRN Osvaldo Shipper, MD      . vitamin B-12 (CYANOCOBALAMIN) tablet 1,000 mcg  1,000 mcg Oral Daily Osvaldo Shipper, MD   1,000 mcg at 02/02/12 1626  . warfarin (COUMADIN) tablet 5 mg  5 mg Oral ONCE-1800 Maudry Mayhew, PHARMD   5 mg at 02/02/12 1823  . Warfarin - Pharmacist Dosing Inpatient   Does not apply q1800 Maudry Mayhew, PHARMD      . DISCONTD: albuterol (PROVENTIL HFA;VENTOLIN HFA) 108 (90 BASE) MCG/ACT inhaler 2 puff  2 puff Inhalation Q6H Osvaldo Shipper, MD      . DISCONTD: albuterol (PROVENTIL) (5 MG/ML) 0.5% nebulizer solution 2.5 mg  2.5 mg Nebulization Q6H Osvaldo Shipper, MD   2.5 mg at 02/02/12 2003  . DISCONTD: albuterol (PROVENTIL) (5 MG/ML) 0.5% nebulizer solution 5 mg  5 mg Nebulization Q2H PRN Celene Kras, MD       . DISCONTD: furosemide (LASIX) injection 80 mg  80 mg Intravenous Q12H Osvaldo Shipper, MD      . DISCONTD: lisinopril (PRINIVIL,ZESTRIL) tablet 20 mg  20 mg Oral q morning - 10a Osvaldo Shipper, MD   20 mg at 02/02/12 1626  . DISCONTD: LORazepam (ATIVAN) tablet 1 mg  1 mg Oral Q6H PRN Stephani Police, PA   1 mg at 02/02/12 1124  . DISCONTD: potassium chloride 10 mEq in 100 mL IVPB  10 mEq Intravenous Q1 Hr x 4 Stephani Police, PA   10 mEq at 02/02/12 1323  . DISCONTD: potassium chloride SA (K-DUR,KLOR-CON) CR tablet 20 mEq  20 mEq Oral BID Osvaldo Shipper, MD      . DISCONTD: potassium chloride SA (K-DUR,KLOR-CON) CR tablet 40 mEq  40 mEq Oral BID Stephani Police, PA   40 mEq at 02/02/12 1125    PE: General appearance: alert, cooperative and no distress Lungs: Decreased BS bilaterally.  Expiratory wheeze.  no rales. Heart: regular rate and rhythm, S1, S2 normal, no murmur, click, rub or gallop Abdomen: +BS, Nontender.  No distention Extremities: 1+ left posterior tibial edema.  Trace right LEE Pulses: 2+ and symmetric Skin: warm and dry Neurologic: Grossly normal  Lab Results:   Basename 02/02/12 0839  WBC 8.9  HGB 10.4*  HCT 30.8*  PLT 238   BMET  Basename 02/03/12 0455 02/02/12 2115 02/02/12 0839  NA 142 143 144  K 3.4* 4.0 2.9*  CL 101 101 102  CO2 29 31 28   GLUCOSE 150* 167* 128*  BUN 32* 28* 24*  CREATININE 1.03 0.99 0.78  CALCIUM 8.8 9.2 9.2   PT/INR  Basename 02/03/12 0455 02/02/12 1402  LABPROT 27.5* 26.9*  INR 2.51* 2.44*    Assessment/Plan  Principal Problem:  *Acute-on-chronic respiratory failure, probably mixed acute CHF and COPD exacerbation Active Problems:  OBSESSIVE-COMPULSIVE DISORDER  DEPRESSION/ANXIETY  ESSENTIAL HYPERTENSION  ICM- last EF 30-35% 2D 4/11 with grade 2 diastolic dysfunction  PAF (paroxysmal atrial fibrillation), holding NSR on Amiodarone  CHRONIC OBSTRUCTIVE PULMONARY DISEASE, ACUTE EXACERBATION  COPD, still smokes 1/2 pk  day  HYPERGLYCEMIA, BORDERLINE  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 Myoview showed scar 4/11  Acute on chronic combined systolic and diastolic heart failure  Chronic anticoagulation  Plan: Feeling better.   Home Health PT.  INR is therapeutic.  Replete K+.  I/O net - .  BP and HR controlled and stable.  Total CK elevated and trending down.  BNP 8720.0 yesterday.   Recommend continuing IV lasix at current dose and switch to PO in the AM.  Recheck BNP tomorrow.    LOS: 1 day    HAGER, BRYAN 02/03/2012 8:13 AM  I have seen & examined the patient along with Mr. Leron Croak.  I agree with his findings, exam & recommendations noted above.  IV Lasix today & likely change to PO for tomorrow.  Can transfer to Tele.  Seems to be diuresing well -- will need to have CHF education re dietary discretion & Sliding Scale Lasix as OP.    BP is borderline hypotensive in 90s, BB dose decreased yesterday & on ARB -- unlikely able to tolerate titration for additional afterload.  Continue Rx of concomitant COPD per [facility] (still has some upper lung field wheezing)   Marykay Lex, M.D., M.S. [facility] [address] [place], [place]  [address]  [phone] Pager # [phone]  02/03/2012 8:41 AM

## 2012-02-04 DIAGNOSIS — I10 Essential (primary) hypertension: Secondary | ICD-10-CM

## 2012-02-04 LAB — CBC
HCT: 29.7 % — ABNORMAL LOW (ref 36.0–46.0)
Hemoglobin: 9.8 g/dL — ABNORMAL LOW (ref 12.0–15.0)
MCV: 94.3 fL (ref 78.0–100.0)
RDW: 15.7 % — ABNORMAL HIGH (ref 11.5–15.5)
WBC: 14.1 10*3/uL — ABNORMAL HIGH (ref 4.0–10.5)

## 2012-02-04 LAB — BASIC METABOLIC PANEL
BUN: 49 mg/dL — ABNORMAL HIGH (ref 6–23)
CO2: 30 mEq/L (ref 19–32)
Calcium: 8.8 mg/dL (ref 8.4–10.5)
GFR calc non Af Amer: 54 mL/min — ABNORMAL LOW (ref >90)
Glucose, Bld: 105 mg/dL — ABNORMAL HIGH (ref 70–99)
Potassium: 4.1 mEq/L (ref 3.5–5.1)

## 2012-02-04 LAB — PROTIME-INR: INR: 3.08 — ABNORMAL HIGH (ref 0.00–1.49)

## 2012-02-04 MED ORDER — POLYSACCHARIDE IRON COMPLEX 150 MG PO CAPS
150.0000 mg | ORAL_CAPSULE | Freq: Every day | ORAL | Status: DC
  Administered 2012-02-04: 150 mg via ORAL
  Filled 2012-02-04: qty 1

## 2012-02-04 MED ORDER — FUROSEMIDE 80 MG PO TABS
80.0000 mg | ORAL_TABLET | Freq: Two times a day (BID) | ORAL | Status: DC
  Filled 2012-02-04 (×3): qty 1

## 2012-02-04 MED ORDER — LOSARTAN POTASSIUM 25 MG PO TABS: 25.0000 mg | ORAL_TABLET | Freq: Every day | ORAL | Status: AC

## 2012-02-04 MED ORDER — PREDNISONE 20 MG PO TABS: 40.0000 mg | ORAL_TABLET | Freq: Every day | ORAL | Status: AC

## 2012-02-04 MED ORDER — FUROSEMIDE 10 MG/ML IJ SOLN
INTRAMUSCULAR | Status: AC
  Administered 2012-02-04: 60 mg via INTRAVENOUS
  Filled 2012-02-04: qty 8

## 2012-02-04 MED ORDER — POLYSACCHARIDE IRON COMPLEX 150 MG PO CAPS: 150.0000 mg | ORAL_CAPSULE | Freq: Every day | ORAL | Status: AC

## 2012-02-04 NOTE — Discharge Summary (Signed)
Physician Discharge Summary  Marcia [name] ZOX:[id number] DOB: Oct 09, 1950 DOA: 02/02/2012  PCP: [name], MD  Admit date: 02/02/2012 Discharge date: 02/04/2012  Recommendations for Outpatient Follow-up:  1. Followup with [facility] cardiology in 1 week. 2. [facility]up [facility] tomorrow 02/05/2012.  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 Myoview showed scar 4/11  Acute on chronic combined systolic and diastolic heart failure  Chronic anticoagulation  Discharge Condition: Stable  Diet recommendation: Heart healthy diet  Wt Readings from Last 3 Encounters:  02/04/12 106 kg (233 lb 11 oz)  01/01/12 106.142 kg (234 lb)  04/01/10 85.276 kg (188 lb)   History of present illness:  61 year old Caucasian female with history of chronic systolic heart failure COPD, anxiety, and hypertension who presented on 02/02/2012 with shortness of breath.  Hospital Course:  Acute respiratory failure, most probably as a result of acute systolic congestive heart failure with possible COPD exacerbation Admits to dietary indiscretion, and weight gain, and leg swelling over the past few days and weeks. 2-D echocardiogram on 02/03/2012 showed cavity size was normal, wall thickness was normal, systolic function was moderately reduced, ejection fraction 35-40%, moderate to severe hypokinesis of the mid to distal anteroseptal, anterior and apical walls consistent with  prior LAD territory infarct.  Patient to follow up with [facility] after discharge 1 week.  Initially started on IV Lasix which was transitioned to back to home furosemide dose.  Patient was instructed to check her weight carefully at home, if she has any weight gain of 2-3 pounds she is to inform her cardiologist's of the weight change. Will arrange home health RN to help with heart failure management. On ARB.  Possible exacerbation of COPD Mild.  Transitioned Solu-Medrol to prednisone on 02/03/2012.  Taper rapidly, 3 days as outpatient.  Hypokalemia Likely due to diuresis.  Replace as needed.  History of coronary artery disease Cardiac enzymes negative. Continue aspirin.   History of atrial fibrillation Continue with amiodarone and warfarin. Pharmacy will be asked to dose.  Rate controlled.  INR therapeutic.  Patient was instructed to followup [facility] for anticoagulation on 02/05/2012.  Anemia Etiology unclear. Probably from chronic disease. Anemia panel suggests mild iron deficiency anemia, started supplemental iron.   History of anxiety disorder Xanax will be utilized as needed.   Generalized weakness Evaluated by physical therapy, recommended home health physical therapy.    Procedures:  None  Consultations:  Dr. Herbie Baltimore, [facility]  Discharge Exam: Filed Vitals:   02/04/12 1336  BP: 91/68  Pulse: 56  Temp: 98.4 F (36.9 C)  Resp: 18   Filed Vitals:   02/03/12 2100 02/04/12 0500 02/04/12 0957 02/04/12 1336  BP: 105/63 112/74 103/68 91/68  Pulse: 62 60 62 56  Temp: 98.5 F (36.9 C) 98.6 F (37 C)  98.4 F (36.9 C)  TempSrc: Oral Oral  Oral  Resp:    18  Height:      Weight:  106 kg (233 lb 11 oz)    SpO2: 91% 94%  88%   Discharge  Instructions  Discharge Orders    Future Orders Please Complete By Expires   Diet - low sodium heart healthy      Increase activity slowly      Discharge instructions      Comments:   Followup  with OSEI-BONSU,GEORGE, MD (PCP) in 1 week. Followup with [facility] in 1 week. Followup with [facility] coumadin clinic tomorrow (02/05/2012).   Heart Failure patients record your daily weight using the same scale at the same time of day      STOP any activity that causes chest pain, shortness of breath, dizziness, sweating, or exessive weakness      Contraindication to ARB at discharge        Medication List  As of 02/04/2012  2:36 PM   STOP taking these medications         lisinopril 20 MG tablet         TAKE these medications         albuterol 108 (90 BASE) MCG/ACT inhaler   Commonly known as: PROVENTIL HFA;VENTOLIN HFA   Inhale 2 puffs into the lungs every 6 (six) hours as needed. Shortness of breath      ALPRAZolam 0.5 MG tablet   Commonly known as: XANAX   Take 0.5 mg by mouth 3 (three) times daily as needed. For anxiety      amiodarone 200 MG tablet   Commonly known as: PACERONE   Take 200 mg by mouth daily.      aspirin 325 MG tablet   Take 162.5 mg by mouth daily.      busPIRone 5 MG tablet   Commonly known as: BUSPAR   Take 5 mg by mouth Twice daily.      Cholecalciferol 1000 UNITS capsule   Take 1,000 Units by mouth daily.      EPIPEN 2-PAK 0.3 mg/0.3 mL Devi   Generic drug: EPINEPHrine   as directed.      FLUoxetine 20 MG capsule   Commonly known as: PROZAC   Take 20 mg by mouth daily.      fluticasone 50 MCG/ACT nasal spray   Commonly known as: FLONASE   Place 2 sprays into the nose daily as needed. allergies      furosemide 40 MG tablet   Commonly known as: LASIX   Take 80 mg by mouth 2 (two) times daily. Takes 2 tablets twice daily      gabapentin 300 MG capsule   Commonly known as: NEURONTIN   Take 600 mg by mouth every morning.      HYDROcodone-acetaminophen 5-325 MG per tablet   Commonly known as: NORCO/VICODIN   Take 1 tablet by mouth 3 (three) times daily as needed. pain      iron polysaccharides 150 MG capsule   Commonly known as: NIFEREX   Take 1  capsule (150 mg total) by mouth daily.      losartan 25 MG tablet   Commonly known as: COZAAR   Take 1 tablet (25 mg total) by mouth daily.      metolazone 2.5 MG tablet   Commonly known as: ZAROXOLYN   Take 2.5 mg by mouth 2 (two) times daily.      metoprolol tartrate 25 MG tablet   Commonly known as: LOPRESSOR   Take 25 mg by mouth 2 (two) times daily.      potassium chloride SA 20 MEQ tablet   Commonly known as: K-DUR,KLOR-CON   Take 20 mEq by mouth 2 (two) times  daily.      predniSONE 20 MG tablet   Commonly known as: DELTASONE   Take 2 tablets (40 mg total) by mouth daily with breakfast.      PRESCRIPTION MEDICATION   Inject 1 Syringe into the muscle every other day. Allergy shots. Medication kept at doctors office      promethazine 25 MG tablet   Commonly known as: PHENERGAN   Take 25 mg by mouth every 6 (six) hours as needed.      vitamin B-12 1000 MCG tablet   Commonly known as: CYANOCOBALAMIN   Take 1,000 mcg by mouth daily.      vitamin E 400 UNIT capsule   Take 400 Units by mouth daily.      warfarin 5 MG tablet   Commonly known as: COUMADIN   Take 5-7.5 mg by mouth daily. Monday and Friday takes 1.5 tab  Rest of week 1 tablet            The results of significant diagnostics from this hospitalization (including imaging, microbiology, ancillary and laboratory) are listed below for reference.    Significant Diagnostic Studies: Dg Chest Port 1 View  02/02/2012  *RADIOLOGY REPORT*  Clinical Data: Chest pain, shortness of breath.  PORTABLE CHEST - 1 VIEW  Comparison: 03/17/2010  Findings: Left AICD remains in place, unchanged.  Heart is borderline in size.  Mild interstitial prominence could reflect interstitial edema, new since prior study.  No confluent opacities or effusions.  IMPRESSION: Suspect mild interstitial edema.  Original Report Authenticated By: Cyndie Chime, M.D.    Microbiology: Recent Results (from the past 240 hour(s))  MRSA PCR  SCREENING     Status: Normal   Collection Time   02/02/12 12:48 PM      Component Value Range Status Comment   MRSA by PCR NEGATIVE  NEGATIVE Final      Labs: Basic Metabolic Panel:  Lab 02/04/12 4098 02/03/12 0455 02/02/12 2115 02/02/12 0839  NA 138 142 143 144  K 4.1 3.4* 4.0 2.9*  CL 97 101 101 102  CO2 30 29 31 28   GLUCOSE 105* 150* 167* 128*  BUN 49* 32* 28* 24*  CREATININE 1.10 1.03 0.99 0.78  CALCIUM 8.8 8.8 9.2 9.2  MG -- -- 2.1 --  PHOS -- -- -- --   Liver Function Tests: No results found for this basename: AST:5,ALT:5,ALKPHOS:5,BILITOT:5,PROT:5,ALBUMIN:5 in the last 168 hours No results found for this basename: LIPASE:5,AMYLASE:5 in the last 168 hours No results found for this basename: AMMONIA:5 in the last 168 hours CBC:  Lab 02/04/12 0530 02/02/12 0839  WBC 14.1* 8.9  NEUTROABS -- --  HGB 9.8* 10.4*  HCT 29.7* 30.8*  MCV 94.3 93.6  PLT 268 238   Cardiac Enzymes:  Lab 02/03/12 0455 02/02/12 2115 02/02/12 1402  CKTOTAL 652* 931* 1362*  CKMB 2.1 2.6 3.3  CKMBINDEX -- -- --  TROPONINI <0.30 <0.30 <0.30   BNP: BNP (last 3 results)  Basename 02/04/12 0530 02/02/12 0839  PROBNP 2596.0* 8720.0*   CBG: No results found for this basename: GLUCAP:5 in the last 168 hours  Time coordinating discharge: 40 minutes  Signed:  Kie Calvin A  [facility] 02/04/2012, 2:36 PM

## 2012-02-04 NOTE — Progress Notes (Signed)
D/c orders received;IV removed with gauze on, pt remains in stable condition, pt meds and instructions reviewed and given to pt; went over extensively with pt HF education and intake of salt, taking meds, and weighing herself daily; pt setup with [facility] services; pt d/c to home

## 2012-02-04 NOTE — Progress Notes (Signed)
[facility]  Subjective: Feeling better.  Breathing improved.  Complains of paresthesia in feet.  Objective: Vital signs in last 24 hours: Temp:  [98.2 F (36.8 C)-98.6 F (37 C)] 98.6 F (37 C) (08/08 0500) Pulse Rate:  [57-69] 60  (08/08 0500) Resp:  [18-22] 18  (08/07 1654) BP: (78-112)/(48-74) 112/74 mmHg (08/08 0500) SpO2:  [91 %-96 %] 94 % (08/08 0500) Weight:  [106 kg (233 lb 11 oz)] 106 kg (233 lb 11 oz) (08/08 0500) Last BM Date: 02/03/12  Intake/Output from previous day: 08/07 0701 - 08/08 0700 In: 440 [P.O.:440] Out: 1026 [Urine:1025; Stool:1] Intake/Output this shift:    Medications Current Facility-Administered Medications  Medication Dose Route Frequency Provider Last Rate Last Dose  . acetaminophen (TYLENOL) tablet 650 mg  650 mg Oral Q4H PRN Osvaldo Shipper, MD      . albuterol (PROVENTIL) (5 MG/ML) 0.5% nebulizer solution 2.5 mg  2.5 mg Nebulization Q6H PRN Provider Default, MD   2.5 mg at 02/03/12 2100  . ALPRAZolam Prudy Feeler) tablet 0.5 mg  0.5 mg Oral TID PRN Osvaldo Shipper, MD   0.5 mg at 02/03/12 2236  . amiodarone (PACERONE) tablet 200 mg  200 mg Oral Daily Osvaldo Shipper, MD   200 mg at 02/03/12 0859  . aspirin tablet 162.5 mg  162.5 mg Oral Daily Osvaldo Shipper, MD   162.5 mg at 02/03/12 0858  . busPIRone (BUSPAR) tablet 5 mg  5 mg Oral BID Osvaldo Shipper, MD   5 mg at 02/03/12 2235  . FLUoxetine (PROZAC) capsule 20 mg  20 mg Oral Daily Osvaldo Shipper, MD   20 mg at 02/03/12 0859  . furosemide (LASIX) injection 60 mg  60 mg Intravenous Q12H Osvaldo Shipper, MD   60 mg at 02/04/12 0729  . gabapentin (NEURONTIN) capsule 600 mg  600 mg Oral q morning - 10a Osvaldo Shipper, MD   600 mg at 02/03/12 0859  . HYDROcodone-acetaminophen (NORCO/VICODIN) 5-325 MG per tablet 1 tablet  1 tablet Oral Q6H PRN Osvaldo Shipper, MD   1 tablet at 02/04/12 0106  . losartan (COZAAR) tablet 25 mg  25 mg Oral Daily Eda Paschal [place], [place]   25 mg at 02/03/12  0858  . metoprolol tartrate (LOPRESSOR) tablet 12.5 mg  12.5 mg Oral BID Osvaldo Shipper, MD   12.5 mg at 02/03/12 2236  . ondansetron (ZOFRAN) injection 4 mg  4 mg Intravenous Q6H PRN Osvaldo Shipper, MD   4 mg at 02/02/12 1816  . potassium chloride SA (K-DUR,KLOR-CON) CR tablet 20 mEq  20 mEq Oral BID Osvaldo Shipper, MD   20 mEq at 02/03/12 2236  . predniSONE (DELTASONE) tablet 40 mg  40 mg Oral Q breakfast Cristal Ford, MD   40 mg at 02/04/12 0729  . sodium chloride 0.9 % injection 3 mL  3 mL Intravenous Q12H Osvaldo Shipper, MD   3 mL at 02/03/12 2236  . sodium chloride 0.9 % injection 3 mL  3 mL Intravenous PRN Osvaldo Shipper, MD      . vitamin B-12 (CYANOCOBALAMIN) tablet 1,000 mcg  1,000 mcg Oral Daily Osvaldo Shipper, MD   1,000 mcg at 02/03/12 0859  . warfarin (COUMADIN) tablet 5 mg  5 mg Oral ONCE-1800 Maudry Mayhew, PHARMD   5 mg at 02/03/12 1753  . Warfarin - Pharmacist Dosing Inpatient   Does not apply q1800 Maudry Mayhew, PHARMD      . DISCONTD: methylPREDNISolone sodium succinate (SOLU-MEDROL) 125 mg/2 mL injection 60  mg  60 mg Intravenous Q12H Osvaldo Shipper, MD   60 mg at 02/03/12 0857    PE: General appearance: alert, cooperative and no distress Lungs: clear to auscultation bilaterally Heart: regular rate and rhythm, S1, S2 normal, no murmur, click, rub or gallop Extremities: No LEE Pulses: 2+ and symmetric Skin: warm/ dry Neurologic: Grossly normal  Lab Results:   Basename 02/04/12 0530 02/02/12 0839  WBC 14.1* 8.9  HGB 9.8* 10.4*  HCT 29.7* 30.8*  PLT 268 238   BMET  Basename 02/04/12 0530 02/03/12 0455 02/02/12 2115  NA 138 142 143  K 4.1 3.4* 4.0  CL 97 101 101  CO2 30 29 31   GLUCOSE 105* 150* 167*  BUN 49* 32* 28*  CREATININE 1.10 1.03 0.99  CALCIUM 8.8 8.8 9.2   PT/INR  Basename 02/04/12 0530 02/03/12 0455 02/02/12 1402  LABPROT 32.3* 27.5* 26.9*  INR 3.08* 2.51* 2.44*    Assessment/Plan   Principal Problem:  *Acute-on-chronic respiratory failure,  probably mixed acute CHF and COPD exacerbation Active Problems:  OBSESSIVE-COMPULSIVE DISORDER  DEPRESSION/ANXIETY  ESSENTIAL HYPERTENSION  ICM- last EF 30-35% 2D 4/11 with grade 2 diastolic dysfunction  PAF (paroxysmal atrial fibrillation), holding NSR on Amiodarone  CHRONIC OBSTRUCTIVE PULMONARY DISEASE, ACUTE EXACERBATION  COPD, still smokes 1/2 pk day  HYPERGLYCEMIA, BORDERLINE  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 Myoview showed scar 4/11  Acute on chronic combined systolic and diastolic heart failure  Chronic anticoagulation  Plan:  BNP  decreased from 8720 to 2596.0.  SCr stable.  2D echo completed and pending read.  Changed back to home dose Lasix.  Discussed weight mgt and salt intake again.  Will arranged FU with [facility] in seven days.  Can dc home from cards standpoint after we look at echo. DC weight 106 kg. She needs DM follow up.   LOS: 2 days    Zoran Yankee 02/04/2012 9:08 AM

## 2012-02-04 NOTE — Progress Notes (Signed)
Subjective: Breathing better, no significant complaints.  Wondering if she can be discharged.  Objective: Vital signs in last 24 hours: Filed Vitals:   02/03/12 1654 02/03/12 2100 02/04/12 0500 02/04/12 0957  BP: 103/71 105/63 112/74 103/68  Pulse: 57 62 60 62  Temp: 98.2 F (36.8 C) 98.5 F (36.9 C) 98.6 F (37 C)   TempSrc:  Oral Oral   Resp: 18     Height:      Weight:   106 kg (233 lb 11 oz)   SpO2: 92% 91% 94%    Weight change: -1.5 kg (-3 lb 4.9 oz)  Intake/Output Summary (Last 24 hours) at 02/04/12 1052 Last data filed at 02/04/12 1000  Gross per 24 hour  Intake    320 ml  Output   2225 ml  Net  -1905 ml    Physical Exam: General: Awake, Oriented, No acute distress. HEENT: EOMI. Neck: Supple CV: S1 and S2 Lungs: Good air movement bilaterally. Abdomen: Soft, Nontender, Nondistended, +bowel sounds. Ext: Good pulses. Trace edema.  Lab Results: Basic Metabolic Panel:  Lab 02/04/12 1610 02/03/12 0455 02/02/12 2115 02/02/12 0839  NA 138 142 143 144  K 4.1 3.4* 4.0 2.9*  CL 97 101 101 102  CO2 30 29 31 28   GLUCOSE 105* 150* 167* 128*  BUN 49* 32* 28* 24*  CREATININE 1.10 1.03 0.99 0.78  CALCIUM 8.8 8.8 9.2 9.2  MG -- -- 2.1 --  PHOS -- -- -- --   Liver Function Tests: No results found for this basename: AST:5,ALT:5,ALKPHOS:5,BILITOT:5,PROT:5,ALBUMIN:5 in the last 168 hours No results found for this basename: LIPASE:5,AMYLASE:5 in the last 168 hours No results found for this basename: AMMONIA:5 in the last 168 hours CBC:  Lab 02/04/12 0530 02/02/12 0839  WBC 14.1* 8.9  NEUTROABS -- --  HGB 9.8* 10.4*  HCT 29.7* 30.8*  MCV 94.3 93.6  PLT 268 238   Cardiac Enzymes:  Lab 02/03/12 0455 02/02/12 2115 02/02/12 1402  CKTOTAL 652* 931* 1362*  CKMB 2.1 2.6 3.3  CKMBINDEX -- -- --  TROPONINI <0.30 <0.30 <0.30   BNP (last 3 results)  Basename 02/04/12 0530 02/02/12 0839  PROBNP 2596.0* 8720.0*   CBG: No results found for this basename: GLUCAP:5 in  the last 168 hours No results found for this basename: HGBA1C:5 in the last 72 hours Other Labs: No components found with this basename: POCBNP:3 No results found for this basename: DDIMER:2 in the last 168 hours No results found for this basename: CHOL:2,HDL:2,LDLCALC:2,TRIG:2,CHOLHDL:2,LDLDIRECT:2 in the last 168 hours No results found for this basename: TSH,T4TOTAL,FREET3,T3FREE,FREET4,THYROIDAB in the last 168 hours  Lab 02/03/12 0455  VITAMINB12 1318*  FOLATE 11.8  FERRITIN 153  TIBC 309  IRON 28*  RETICCTPCT 2.1    Micro Results: Recent Results (from the past 240 hour(s))  MRSA PCR SCREENING     Status: Normal   Collection Time   02/02/12 12:48 PM      Component Value Range Status Comment   MRSA by PCR NEGATIVE  NEGATIVE Final     Studies/Results: No results found.  Medications: I have reviewed the patient's current medications. Scheduled Meds:    . amiodarone  200 mg Oral Daily  . aspirin  162.5 mg Oral Daily  . busPIRone  5 mg Oral BID  . FLUoxetine  20 mg Oral Daily  . furosemide  80 mg Oral BID  . gabapentin  600 mg Oral q morning - 10a  . iron polysaccharides  150 mg Oral Daily  .  losartan  25 mg Oral Daily  . metoprolol tartrate  12.5 mg Oral BID  . potassium chloride SA  20 mEq Oral BID  . predniSONE  40 mg Oral Q breakfast  . sodium chloride  3 mL Intravenous Q12H  . vitamin B-12  1,000 mcg Oral Daily  . warfarin  5 mg Oral ONCE-1800  . Warfarin - Pharmacist Dosing Inpatient   Does not apply q1800  . DISCONTD: furosemide  60 mg Intravenous Q12H   Continuous Infusions:  PRN Meds:.acetaminophen, albuterol, ALPRAZolam, HYDROcodone-acetaminophen, ondansetron (ZOFRAN) IV, sodium chloride  Assessment/Plan: Acute respiratory failure, most probably as a result of acute systolic congestive heart failure with possible COPD exacerbation Admits to dietary indiscretion and weight gain and leg swelling over the past few days and weeks. Based on an echo from 2011  her EF is about 30-35%.  2-D echocardiogram on 02/03/2012 showed cavity size was normal, wall thickness was normal, systolic function was moderately reduced, ejection fraction 35-40%, moderate to severe hypokinesis of the mid to distal anteroseptal, anterior and apical walls consistent with prior LAD territory infarct.  Patient went to follow up with [facility] after discharge.  Transition back to home furosemide dose.  Patient was instructed to check her weight carefully at home, if she has any weight gain of 2-3 pounds she is to inform her cardiologist's of the weight change.  Possible exacerbation of COPD Mild.  Transitioned Solu-Medrol to prednisone on 02/03/2012.  Taper rapidly. We keep her on albuterol nebulizers every 6 hours. No need for any antibiotics at this time.   Hypokalemia Likely due to diuresis.  Replace as needed.  History of coronary artery disease Cardiac enzymes negative. Continue aspirin.   History of atrial fibrillation Continue with amiodarone and warfarin. Pharmacy will be asked to dose.  Rate controlled.  INR therapeutic.  Patient was instructed to [facility] for [facility]agulation on 02/05/2012.  Anemia Etiology unclear. Probably from chronic disease. Anemia panel suggests mild iron deficiency anemia, started supplemental iron.   History of anxiety disorder Xanax will be utilized as needed.   DVT, prophylaxis On coumadin.  Tobacco abuse Counseled patient on cessation.  Full code.  Disposition Discharge the patient today.   LOS: 2 days  Truly Stankiewicz A, MD 02/04/2012, 10:52 AM

## 2012-02-04 NOTE — Progress Notes (Signed)
Occupational Therapy Treatment Patient Details Name: Marcia [name] MRN: [id number] DOB: 1951/01/19 Today's Date: 02/04/2012 Time: 1447-1500 OT Time Calculation (min): 13 min  OT Assessment / Plan / Recommendation Comments on Treatment Session Pt. did well in session and is progressing towards goals.     Follow Up Recommendations  Supervision - Intermittent    Barriers to Discharge       Equipment Recommendations  Tub/shower bench    Recommendations for Other Services    Frequency Min 2X/week   Plan Discharge plan remains appropriate    Precautions / Restrictions Precautions Precautions: Fall Restrictions Weight Bearing Restrictions: No   Pertinent Vitals/Pain No pain reported.     ADL  Grooming: Performed;Brushing hair;Teeth care;Modified independent Where Assessed - Grooming: Supported standing Toilet Transfer: [name], retail Method: Sit to [name]: Other (comment) (EOB) Tub/Shower Transfer: Engineer, manufacturing Method: Location manager: [name] Used: Gait belt ADL Comments: OT explained benefit of using a tub transfer bench and explained that the way she is currently getting out of her tub is not safe. Pt. performed tub transfer with tub transfer bench and agreed it was much easier. She indicated she would like this piece of equipment. Pt. brushed hair while standing unsupported and brushed teeth while standing supported at sink. OT educated pt. on energy conservation. j      OT Goals Acute Rehab OT Goals OT Goal Formulation: With patient Time For Goal Achievement: 02/10/12 Potential to Achieve Goals: Good ADL Goals Pt Will Transfer to Toilet: with modified independence;Ambulation;Comfort height toilet Pt Will Perform Tub/Shower Transfer: Tub transfer;with modified independence;Ambulation;with DME ADL Goal: Tub/Shower Transfer -  Progress: Progressing toward goals Miscellaneous OT Goals Miscellaneous OT Goal #1: Pt will independently verbalize and demonstrate at least 3 energy conservation techniques during ADL activity. OT Goal: Miscellaneous Goal #1 - Progress: Progressing toward goals  Visit Information  Last OT Received On: 02/04/12 Assistance Needed: +1    Subjective Data   Pt. Verbalized that she would like a tub transfer bench.     Prior Functioning       Cognition  Overall Cognitive Status: Appears within functional limits for tasks assessed/performed Arousal/Alertness: Awake/alert Orientation Level: Appears intact for tasks assessed Behavior During Session: [name] for tasks performed    Mobility Bed Mobility Bed Mobility: Supine to Sit;Sitting - Scoot to Edge of Bed Supine to Sit: 7: Independent Sitting - Scoot to [place] of Bed: 7: Independent Transfers Transfers: Sit to Stand;Stand to Sit Sit to Stand: With upper extremity assist;From bed;5: Supervision Stand to Sit: To bed;With upper extremity assist;5: Supervision           End of Session OT - End of Session Activity Tolerance: Patient tolerated treatment well Patient left: with call bell/phone within reach;in bed  GO     Ila Landowski 02/04/2012, 3:24 PM

## 2012-02-04 NOTE — Progress Notes (Signed)
Pt. Seen and examined. Agree with the NP/PA-C note as written. Echo reviewed, EF ~35-40%, LAD territory scar. Stage 2 diastolic dysfunction and signs of volume overload. Therefore, acute on chronic combined systolic and diastolic heart failure. Improved now with diuretics. Probable dietary indiscretion. Ok for discharge from our standpoint. Home lasix BID and metolazone. Follow-up with Dr. Royann Shivers.  Chrystie Nose, MD, [facility] Attending Cardiologist [facility]

## 2012-02-04 NOTE — Progress Notes (Signed)
I agree with the following treatment note after reviewing documentation.   Johnston, Karyss Frese Brynn   OTR/L Pager: [phone] Office: [phone] .   

## 2012-02-04 NOTE — Progress Notes (Signed)
ANTICOAGULATION CONSULT NOTE - Follow up Consult  Pharmacy Consult for Coumadin Indication: atrial fibrillation  Allergies  Allergen Reactions  . Atrovent Other (See Comments)    Either chest pain or heart races  . Flovent (Fluticasone Propionate) Other (See Comments)    Chest pain or heart races  . Nitroglycerin Other (See Comments)    Doctor told her not to ever use it    Patient Measurements: Height: 5\' 6"  (167.6 cm) Weight: 233 lb 11 oz (106 kg) IBW/kg (Calculated) : 59.3   Vital Signs: Temp: 98.6 F (37 C) (08/08 0500) Temp src: Oral (08/08 0500) BP: 112/74 mmHg (08/08 0500) Pulse Rate: 60  (08/08 0500)  Labs:  Basename 02/04/12 0530 02/03/12 0455 02/02/12 2115 02/02/12 1402 02/02/12 0839  HGB 9.8* -- -- -- 10.4*  HCT 29.7* -- -- -- 30.8*  PLT 268 -- -- -- 238  APTT -- -- -- -- --  LABPROT 32.3* 27.5* -- 26.9* --  INR 3.08* 2.51* -- 2.44* --  HEPARINUNFRC -- -- -- -- --  CREATININE 1.10 1.03 0.99 -- --  CKTOTAL -- 652* 931* 1362* --  CKMB -- 2.1 2.6 3.3 --  TROPONINI -- <0.30 <0.30 <0.30 --    Estimated Creatinine Clearance: 68.6 ml/min (by C-G formula based on Cr of 1.1).   Medical History: Past Medical History  Diagnosis Date  . Atrial fibrillation   . COPD (chronic obstructive pulmonary disease)   . HBP (high blood pressure)   . Arthritis   . PTSD (post-traumatic stress disorder)   . OCD (obsessive compulsive disorder)   . Anxiety   . Depression   . Cancer     appendix  . High cholesterol   . Heart disease   . Poor circulation   . Heart attack   . Pneumonia   . Bronchitis   . Miscarriage 1985  . CHF (congestive heart failure)   . ICD (implantable cardiac defibrillator) in place   . Shortness of breath   . Heart murmur   . GERD (gastroesophageal reflux disease)     Medications:  Prescriptions prior to admission  Medication Sig Dispense Refill  . albuterol (PROVENTIL HFA;VENTOLIN HFA) 108 (90 BASE) MCG/ACT inhaler Inhale 2 puffs into  the lungs every 6 (six) hours as needed. Shortness of breath      . ALPRAZolam (XANAX) 0.5 MG tablet Take 0.5 mg by mouth 3 (three) times daily as needed. For anxiety      . amiodarone (PACERONE) 200 MG tablet Take 200 mg by mouth daily.        Marland Kitchen aspirin 325 MG tablet Take 162.5 mg by mouth daily.      . busPIRone (BUSPAR) 5 MG tablet Take 5 mg by mouth Twice daily.      . Cholecalciferol 1000 UNITS capsule Take 1,000 Units by mouth daily.        Marland Kitchen EPIPEN 2-PAK 0.3 MG/0.3ML DEVI as directed.      Marland Kitchen FLUoxetine (PROZAC) 20 MG capsule Take 20 mg by mouth daily.       . fluticasone (FLONASE) 50 MCG/ACT nasal spray Place 2 sprays into the nose daily as needed. allergies      . furosemide (LASIX) 40 MG tablet Take 80 mg by mouth 2 (two) times daily. Takes 2 tablets twice daily      . gabapentin (NEURONTIN) 300 MG capsule Take 600 mg by mouth every morning.       Marland Kitchen HYDROcodone-acetaminophen (NORCO) 5-325 MG per tablet Take 1  tablet by mouth 3 (three) times daily as needed. pain      . lisinopril (PRINIVIL,ZESTRIL) 20 MG tablet Take 20 mg by mouth every morning.       . metolazone (ZAROXOLYN) 2.5 MG tablet Take 2.5 mg by mouth 2 (two) times daily.       . metoprolol tartrate (LOPRESSOR) 25 MG tablet Take 25 mg by mouth 2 (two) times daily.        . potassium chloride SA (K-DUR,KLOR-CON) 20 MEQ tablet Take 20 mEq by mouth 2 (two) times daily.       Marland Kitchen PRESCRIPTION MEDICATION Inject 1 Syringe into the muscle every other day. Allergy shots. Medication kept at doctors office      . promethazine (PHENERGAN) 25 MG tablet Take 25 mg by mouth every 6 (six) hours as needed.      . vitamin B-12 (CYANOCOBALAMIN) 1000 MCG tablet Take 1,000 mcg by mouth daily.      . vitamin E 400 UNIT capsule Take 400 Units by mouth daily.        Marland Kitchen warfarin (COUMADIN) 5 MG tablet Take 5-7.5 mg by mouth daily. Monday and Friday takes 1.5 tab Rest of week 1 tablet        Assessment: 60 yo female with complaints of  chill/fever/swelling in ankles/SOB.  Anticoagulation - Patient on chronic Coumadin. Home dose 5 mg daily except 7.5 mg MF  INR up to 3.08. CBC stable.  Infectious Disease - Afebrile. WBC up to 14.1.  Cardiovascular: ICM, anterior MI 02/2009, CHF EF 30-35% 09/2009, ICD placement 5/11; Afib - elevated BNP, trop neg x3. 112/74,60. 2D echo results pending. Meds: amio po, ASA 162.5mg , IV lasix, losartan, metoprolol, K  Endocrinology - methylpred >>pred,  Gastrointestinal / Nutrition -Cardiac diet.  Neurology - PTSA, OCD, anxiety, depression - fluoxetine, buspirone, gabapentin  Nephrology - Scr 1.1 stable, K+ replaced to 4.1  Pulmonary - tobacco use ongoing, COPD  Hematology / Oncology -CBC stable  PTA Medication Issues - cholecalciferol, flonase, metolazone, promethazine, vitamin e  Goal of Therapy:  INR 2-3 Monitor platelets by anticoagulation protocol: Yes   Plan:  Hold Coumadin today x 1  Marcia [name] S. Merilynn Finland, PharmD, [facility] Clinical Staff Pharmacist Pager [phone]  02/04/2012,8:46 AM

## 2012-06-16 IMAGING — CR DG ABDOMEN ACUTE W/ 1V CHEST
3 series · 3 of 3 positions shown · non-contrast
Comparison: Chest dated 03/17/2010.

CLINICAL DATA: Right lower quadrant abdomen and right flank pain
since last night.  Nausea and vomiting.

ACUTE ABDOMEN SERIES (ABDOMEN 2 VIEW & CHEST 1 VIEW)

[w chest pa]
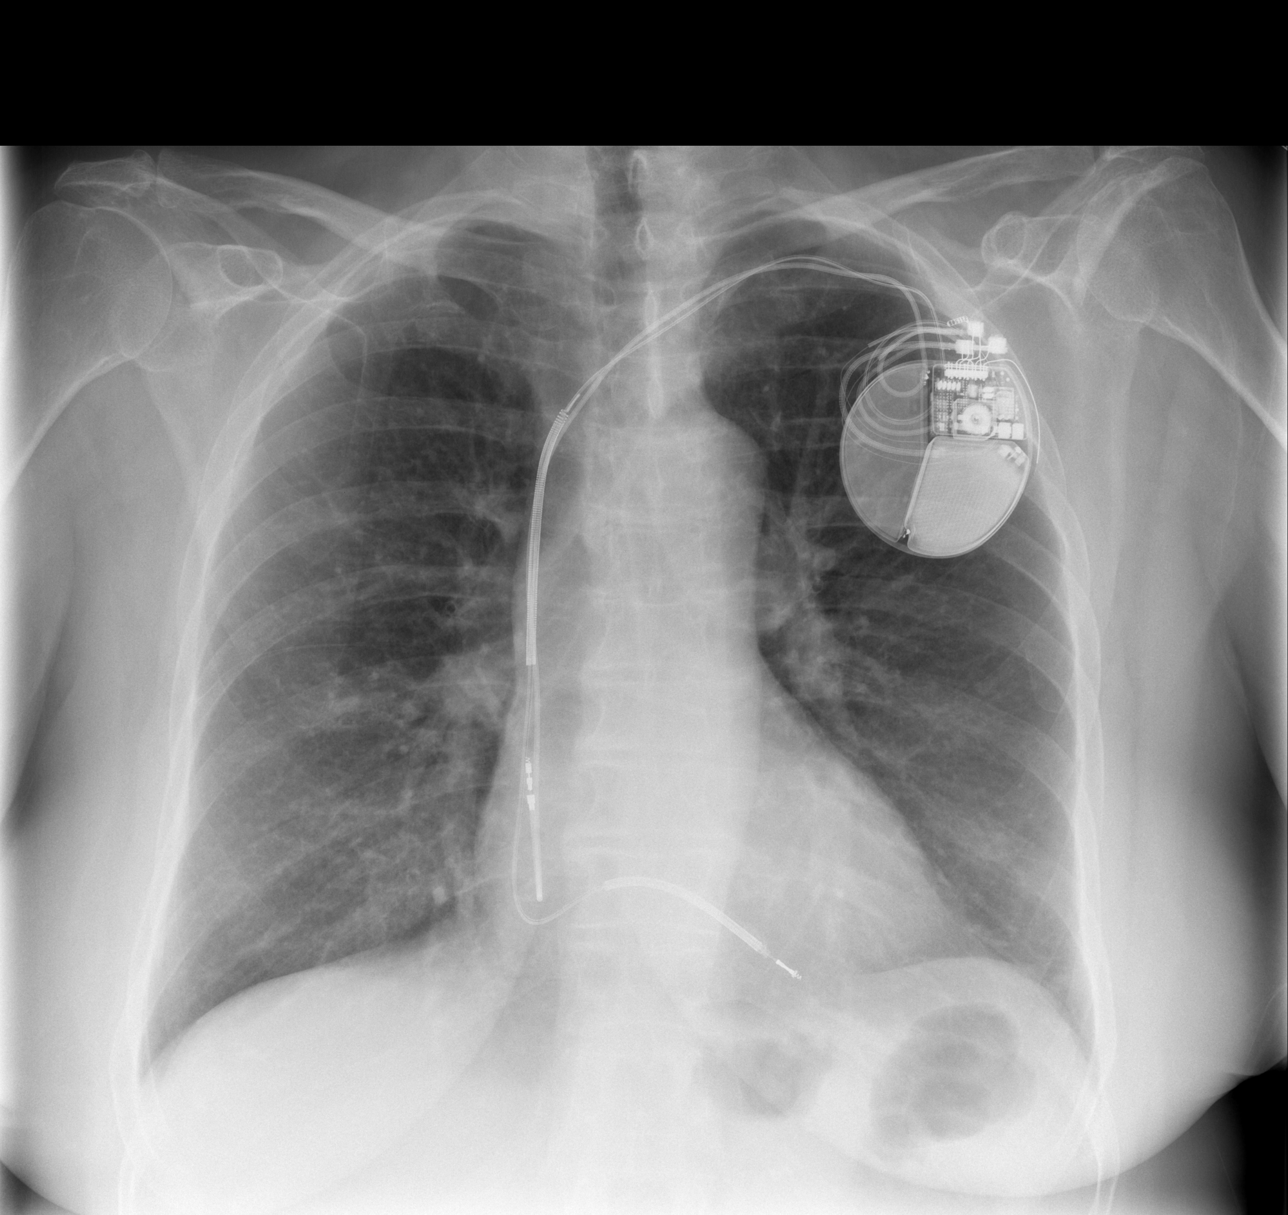

[w abdomen upright]
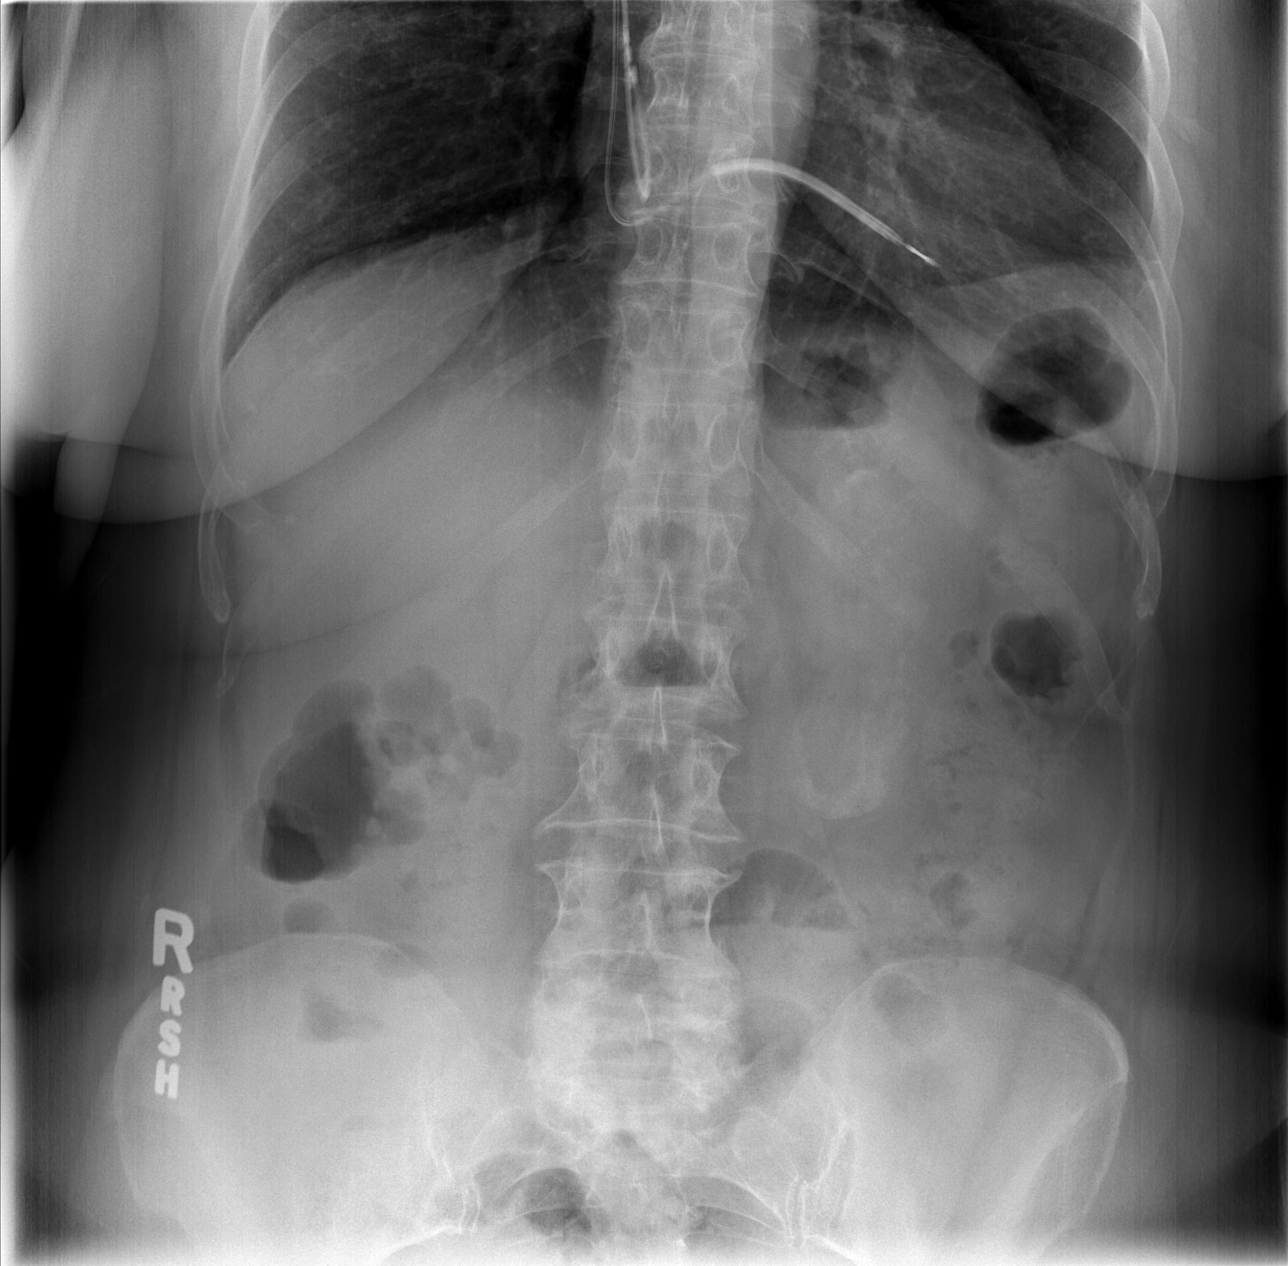

[t abdomen supine]
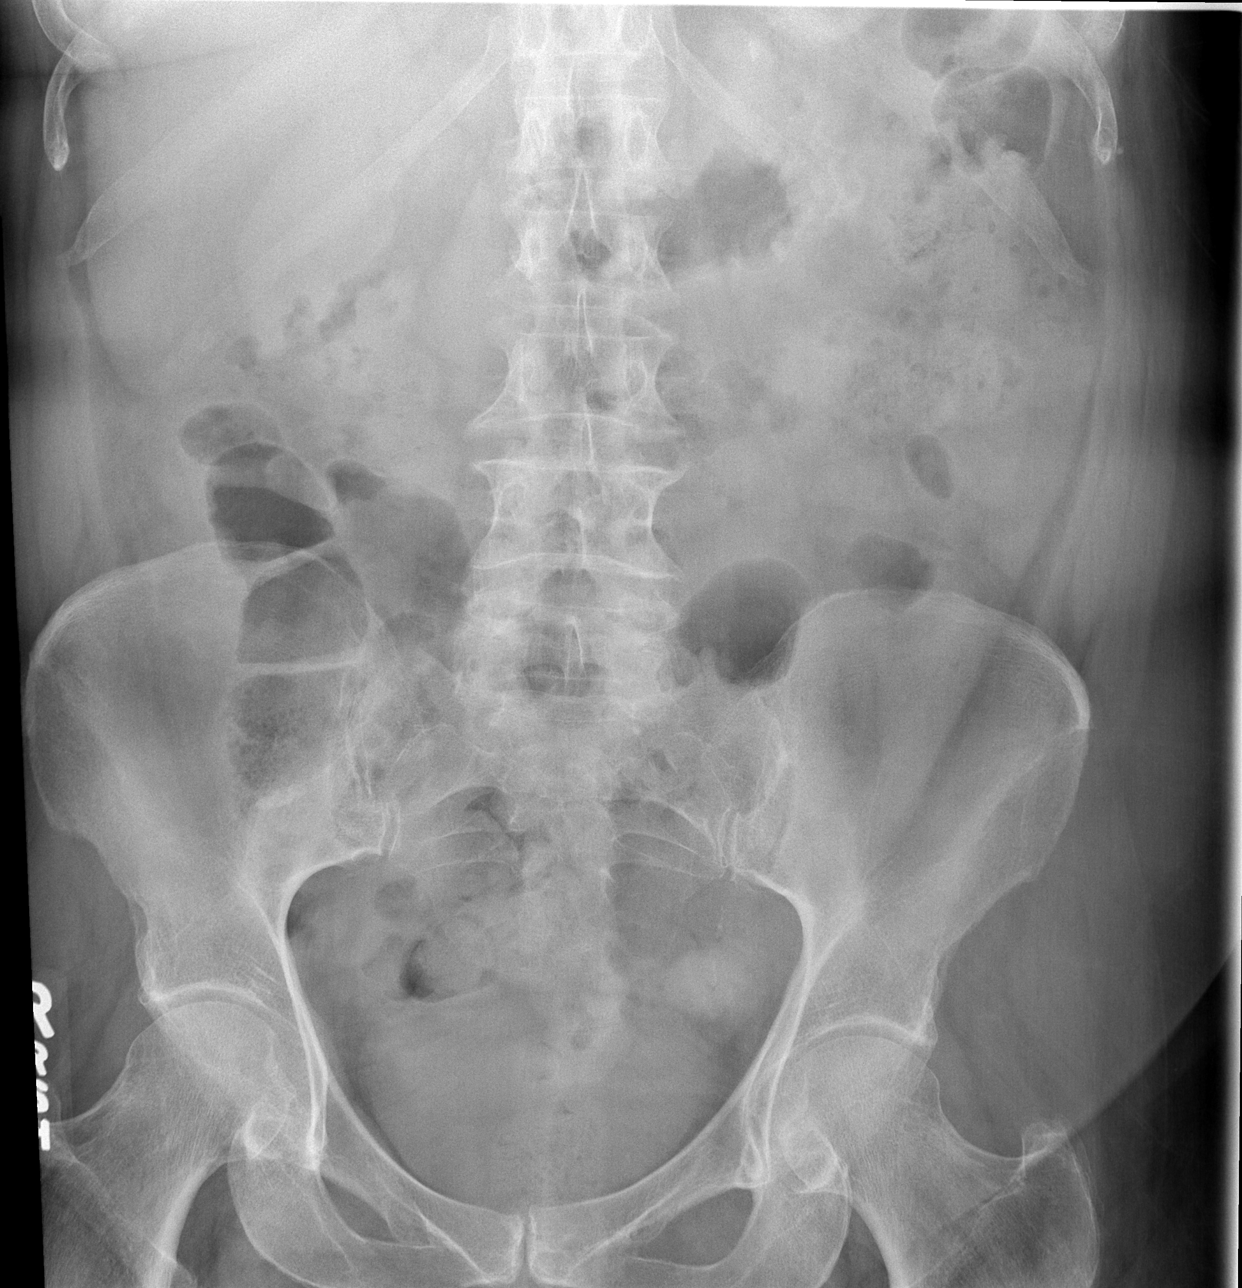

[3 of 3 positions shown; findings below may reference images not displayed]

FINDINGS: Normal sized heart.  Clear lungs.  Stable left subclavian
AICD leads.  The lungs remain mildly hyperexpanded with stable mild
diffuse peribronchial thickening and accentuation of the
interstitial markings.

Normal bowel gas pattern without free peritoneal air.  Lumbar and
thoracic spine degenerative changes.
IMPRESSION: 1.  No acute abnormality.
2.  Stable changes of COPD and chronic bronchitis.

## 2012-09-27 DEATH — deceased

## 2013-03-24 ENCOUNTER — Encounter: Payer: Self-pay | Admitting: Internal Medicine

## 2013-03-24 ENCOUNTER — Telehealth: Payer: Self-pay | Admitting: Internal Medicine

## 2013-03-24 NOTE — Telephone Encounter (Signed)
03-24-13 called pt @ 236pm, NA, mailbox full unable to leave message, sent past due letter/mt

## 2013-09-19 ENCOUNTER — Encounter: Payer: Self-pay | Admitting: *Deleted

## 2013-10-30 ENCOUNTER — Telehealth: Payer: Self-pay | Admitting: Internal Medicine

## 2013-10-30 NOTE — Telephone Encounter (Signed)
PT DECEASED AS OF 09/10/12 PER OBITUARIES/MT

## 2013-12-13 ENCOUNTER — Telehealth: Payer: Self-pay

## 2013-12-13 NOTE — Telephone Encounter (Signed)
Patient past away per Obituary  °
